# Patient Record
Sex: Female | Born: 1950
Health system: Southern US, Community
[De-identification: ages and names within clinical notes are randomized; demographics above are authoritative.]

## PROBLEM LIST (undated history)

## (undated) DIAGNOSIS — M549 Dorsalgia, unspecified: Secondary | ICD-10-CM

## (undated) DIAGNOSIS — E039 Hypothyroidism, unspecified: Secondary | ICD-10-CM

## (undated) DIAGNOSIS — L309 Dermatitis, unspecified: Secondary | ICD-10-CM

## (undated) DIAGNOSIS — F32A Depression, unspecified: Secondary | ICD-10-CM

## (undated) DIAGNOSIS — T7840XA Allergy, unspecified, initial encounter: Secondary | ICD-10-CM

## (undated) DIAGNOSIS — N3281 Overactive bladder: Secondary | ICD-10-CM

## (undated) DIAGNOSIS — I10 Essential (primary) hypertension: Secondary | ICD-10-CM

## (undated) DIAGNOSIS — J45909 Unspecified asthma, uncomplicated: Secondary | ICD-10-CM

## (undated) DIAGNOSIS — F329 Major depressive disorder, single episode, unspecified: Secondary | ICD-10-CM

## (undated) DIAGNOSIS — N3941 Urge incontinence: Secondary | ICD-10-CM

## (undated) DIAGNOSIS — M1990 Unspecified osteoarthritis, unspecified site: Secondary | ICD-10-CM

## (undated) DIAGNOSIS — R198 Other specified symptoms and signs involving the digestive system and abdomen: Secondary | ICD-10-CM

## (undated) DIAGNOSIS — E785 Hyperlipidemia, unspecified: Secondary | ICD-10-CM

## (undated) DIAGNOSIS — K219 Gastro-esophageal reflux disease without esophagitis: Secondary | ICD-10-CM

## (undated) DIAGNOSIS — R519 Headache, unspecified: Secondary | ICD-10-CM

## (undated) DIAGNOSIS — F419 Anxiety disorder, unspecified: Secondary | ICD-10-CM

## (undated) DIAGNOSIS — R0602 Shortness of breath: Secondary | ICD-10-CM

## (undated) DIAGNOSIS — G8929 Other chronic pain: Secondary | ICD-10-CM

## (undated) DIAGNOSIS — M810 Age-related osteoporosis without current pathological fracture: Secondary | ICD-10-CM

## (undated) HISTORY — DX: Other specified symptoms and signs involving the digestive system and abdomen: R19.8

## (undated) HISTORY — PX: BLADDER SUSPENSION: SHX72

## (undated) HISTORY — DX: Hyperlipidemia, unspecified: E78.5

## (undated) HISTORY — DX: Gastro-esophageal reflux disease without esophagitis: K21.9

## (undated) HISTORY — DX: Unspecified asthma, uncomplicated: J45.909

## (undated) HISTORY — PX: BUNIONECTOMY: SHX129

## (undated) HISTORY — PX: NASAL SINUS SURGERY: SHX719

## (undated) HISTORY — DX: Headache, unspecified: R51.9

## (undated) HISTORY — PX: GYNECOLOGIC CRYOSURGERY: SHX857

## (undated) HISTORY — PX: OTHER SURGICAL HISTORY: SHX169

## (undated) HISTORY — DX: Dorsalgia, unspecified: M54.9

## (undated) HISTORY — DX: Essential (primary) hypertension: I10

## (undated) HISTORY — DX: Shortness of breath: R06.02

## (undated) HISTORY — DX: Unspecified osteoarthritis, unspecified site: M19.90

## (undated) HISTORY — PX: ABDOMINOPLASTY: SUR9

## (undated) HISTORY — DX: Overactive bladder: N32.81

## (undated) HISTORY — DX: Age-related osteoporosis without current pathological fracture: M81.0

## (undated) HISTORY — DX: Hypothyroidism, unspecified: E03.9

## (undated) HISTORY — DX: Allergy, unspecified, initial encounter: T78.40XA

## (undated) HISTORY — DX: Depression, unspecified: F32.A

## (undated) HISTORY — DX: Other chronic pain: G89.29

## (undated) HISTORY — DX: Anxiety disorder, unspecified: F41.9

## (undated) HISTORY — PX: TUBAL LIGATION: SHX77

---
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
## 1985-11-05 HISTORY — PX: RHINOPLASTY: SUR1284

## 1989-07-06 HISTORY — PX: NASAL SINUS SURGERY: SHX719

## 1998-05-13 ENCOUNTER — Emergency Department (HOSPITAL_COMMUNITY): Admission: EM | Admit: 1998-05-13 | Discharge: 1998-05-13 | Payer: Self-pay | Admitting: Internal Medicine

## 2000-05-06 ENCOUNTER — Other Ambulatory Visit: Admission: RE | Admit: 2000-05-06 | Discharge: 2000-05-06 | Payer: Self-pay | Admitting: *Deleted

## 2001-04-23 ENCOUNTER — Other Ambulatory Visit: Admission: RE | Admit: 2001-04-23 | Discharge: 2001-04-23 | Payer: Self-pay | Admitting: *Deleted

## 2002-04-22 ENCOUNTER — Other Ambulatory Visit: Admission: RE | Admit: 2002-04-22 | Discharge: 2002-04-22 | Payer: Self-pay | Admitting: *Deleted

## 2003-07-07 ENCOUNTER — Other Ambulatory Visit: Admission: RE | Admit: 2003-07-07 | Discharge: 2003-07-07 | Payer: Self-pay | Admitting: *Deleted

## 2003-11-18 ENCOUNTER — Emergency Department (HOSPITAL_COMMUNITY): Admission: EM | Admit: 2003-11-18 | Discharge: 2003-11-19 | Payer: Self-pay | Admitting: Emergency Medicine

## 2003-11-19 ENCOUNTER — Ambulatory Visit (HOSPITAL_COMMUNITY): Admission: RE | Admit: 2003-11-19 | Discharge: 2003-11-19 | Payer: Self-pay | Admitting: Family Medicine

## 2004-08-07 ENCOUNTER — Other Ambulatory Visit: Admission: RE | Admit: 2004-08-07 | Discharge: 2004-08-07 | Payer: Self-pay | Admitting: *Deleted

## 2004-09-07 ENCOUNTER — Encounter: Admission: RE | Admit: 2004-09-07 | Discharge: 2004-09-07 | Payer: Self-pay | Admitting: *Deleted

## 2004-11-05 HISTORY — PX: TRANSVAGINAL TAPE (TVT) REMOVAL: SHX6154

## 2005-07-03 ENCOUNTER — Other Ambulatory Visit: Admission: RE | Admit: 2005-07-03 | Discharge: 2005-07-03 | Payer: Self-pay | Admitting: Obstetrics and Gynecology

## 2005-08-03 ENCOUNTER — Ambulatory Visit: Payer: Self-pay | Admitting: Cardiology

## 2005-09-07 ENCOUNTER — Ambulatory Visit: Payer: Self-pay | Admitting: Cardiology

## 2005-09-11 ENCOUNTER — Ambulatory Visit: Payer: Self-pay

## 2005-09-17 ENCOUNTER — Ambulatory Visit: Payer: Self-pay | Admitting: Cardiology

## 2005-10-17 ENCOUNTER — Ambulatory Visit: Payer: Self-pay | Admitting: Cardiology

## 2005-10-24 ENCOUNTER — Ambulatory Visit: Payer: Self-pay | Admitting: Cardiology

## 2005-11-16 ENCOUNTER — Ambulatory Visit: Payer: Self-pay | Admitting: Internal Medicine

## 2005-11-30 ENCOUNTER — Ambulatory Visit: Payer: Self-pay | Admitting: Internal Medicine

## 2007-12-23 HISTORY — PX: CARDIOVASCULAR STRESS TEST: SHX262

## 2008-06-21 ENCOUNTER — Other Ambulatory Visit: Admission: RE | Admit: 2008-06-21 | Discharge: 2008-06-21 | Payer: Self-pay | Admitting: Obstetrics & Gynecology

## 2010-07-24 ENCOUNTER — Ambulatory Visit: Payer: Self-pay | Admitting: Family Medicine

## 2010-08-29 ENCOUNTER — Ambulatory Visit: Payer: Self-pay | Admitting: Family Medicine

## 2010-11-05 HISTORY — PX: NASAL SEPTUM SURGERY: SHX37

## 2011-03-19 ENCOUNTER — Other Ambulatory Visit: Payer: Self-pay | Admitting: Family Medicine

## 2011-10-11 ENCOUNTER — Encounter (INDEPENDENT_AMBULATORY_CARE_PROVIDER_SITE_OTHER): Payer: Self-pay | Admitting: General Surgery

## 2011-10-12 ENCOUNTER — Encounter (INDEPENDENT_AMBULATORY_CARE_PROVIDER_SITE_OTHER): Payer: Self-pay | Admitting: General Surgery

## 2011-10-12 ENCOUNTER — Ambulatory Visit (INDEPENDENT_AMBULATORY_CARE_PROVIDER_SITE_OTHER): Payer: 59 | Admitting: General Surgery

## 2011-10-12 VITALS — BP 130/78 | HR 76 | Temp 97.6°F | Resp 14 | Ht 61.0 in | Wt 163.8 lb

## 2011-10-12 DIAGNOSIS — D171 Benign lipomatous neoplasm of skin and subcutaneous tissue of trunk: Secondary | ICD-10-CM

## 2011-10-12 DIAGNOSIS — D1779 Benign lipomatous neoplasm of other sites: Secondary | ICD-10-CM

## 2011-10-12 NOTE — Progress Notes (Signed)
Patient ID: Maureen Love, female   DOB: 08-27-1951, 60 y.o.   MRN: [id number]  Chief Complaint  Patient presents with  . Mass    on back and leg    HPI Maureen Love is a 60 y.o. female.   HPI This patient is referred by Dr. Hyacinth Meeker for evaluation of enlarging right shoulder and back lipoma. She states that she has had this for a while and has also noticed a smaller mass on her right thigh. She states that the mass on her right thigh has not enlarged but has become more tender recently and the right shoulder mass has actually been increasing in size. She saw her gynecologist and her gynecologist also noticed another mass on her left upper back as well. This was not tender. She has no other masses that she knows of and denies any other symptoms. She has no family history of malignancy. Past Medical History  Diagnosis Date  . Arthritis   . Hyperlipidemia     Past Surgical History  Procedure Date  . Nasal sinus surgery   . Mesh for urination     History reviewed. No pertinent family history.  Social History History  Substance Use Topics  . Smoking status: Never Smoker   . Smokeless tobacco: Not on file  . Alcohol Use: Yes    Allergies  Allergen Reactions  . Penicillins     No current outpatient prescriptions on file.    Review of Systems Review of Systems All other review of systems negative or noncontributory except as stated in the HPI  Blood pressure 130/78, pulse 76, temperature 97.6 F (36.4 C), temperature source Temporal, resp. rate 14, height 5\' 1"  (1.549 m), weight 163 lb 12.8 oz (74.299 kg).  Physical Exam Physical Exam  Vitals reviewed. Constitutional: She is oriented to person, place, and time. She appears well-developed and well-nourished. No distress.  HENT:  Head: Normocephalic and atraumatic.  Eyes: Conjunctivae are normal. Pupils are equal, round, and reactive to light. Right eye exhibits no discharge. Left eye exhibits no discharge. No scleral  icterus.  Neck: Normal range of motion. No tracheal deviation present.  Cardiovascular: Normal rate, regular rhythm and normal heart sounds.   Pulmonary/Chest: Effort normal and breath sounds normal. No stridor. No respiratory distress. She has no wheezes.  Abdominal: Soft. Bowel sounds are normal. She exhibits no distension. There is no tenderness.  Musculoskeletal: Normal range of motion. She exhibits no edema.  Neurological: She is alert and oriented to person, place, and time.  Skin: Skin is warm and dry. No rash noted. She is not diaphoretic. No erythema. No pallor.       On her right side she has 2 soft tissue masses on the anterior lateral thigh which are in the 1-2 cm range. There is no evidence of infection and there is no significant tenderness on exam today.  On her right posterior shoulder superficial to her scapula she has a 7 centimeter by 7 cm well-defined soft tissue mass which is most likely consistent with a lipoma. There is no evidence of erythema or infection.  On her left upper medial back she has a 6 cm well-defined lipomatous appearing mass which is nontender.  Psychiatric: She has a normal mood and affect. Her behavior is normal. Judgment and thought content normal.    Data Reviewed   Assessment    Soft tissue masses of her left upper back, right posterior shoulder, and right anterior thigh which are most likely lipomas.  Since some of these are increasing in size and causing symptoms she has decided that she wants them removed for treatment as well as for definitive diagnosis. I explained that these are most likely lipomas and there is a small chance of malignancy although this would be uncommon. I discussed with her the options for continued observation versus surgical excision and she would like to have them removed. We discussed the risks of surgery including infection, bleeding, pain, scarring, persistent symptoms, recurrence, nerve injury and she expressed  understanding and desires to proceed.    Plan    We will plan to proceed with excision of her 2 back masses and her right thigh lesions when convenient for her.       Lodema Pilot DAVID 10/12/2011, 12:40 PM

## 2011-11-23 ENCOUNTER — Encounter (INDEPENDENT_AMBULATORY_CARE_PROVIDER_SITE_OTHER): Payer: Self-pay | Admitting: General Surgery

## 2011-11-23 ENCOUNTER — Ambulatory Visit (INDEPENDENT_AMBULATORY_CARE_PROVIDER_SITE_OTHER): Payer: 59 | Admitting: General Surgery

## 2011-11-23 VITALS — BP 122/78 | HR 72 | Temp 97.4°F | Resp 18 | Ht 61.0 in | Wt 167.0 lb

## 2011-11-23 DIAGNOSIS — D1779 Benign lipomatous neoplasm of other sites: Secondary | ICD-10-CM

## 2011-11-23 DIAGNOSIS — D171 Benign lipomatous neoplasm of skin and subcutaneous tissue of trunk: Secondary | ICD-10-CM

## 2011-11-23 NOTE — Progress Notes (Signed)
Patient ID: Maureen Love, female   DOB: [birth year], 61 y.o.   MRN: [id number]  Chief Complaint  Patient presents with  . Pre-op Exam    Lipoma excision    HPI Maureen Love is a 61 y.o. female.She returns today for a preoperative appointment. She states that she's had no changes since her last visit a month ago. She has an enlarging lipoma of her right upper back and a few lipomas in her right anterior thigh which are symptomatic. She denies any changes in the lesions since her last visit. HPI  Past Medical History  Diagnosis Date  . Arthritis   . Hyperlipidemia   . Lipoma     Past Surgical History  Procedure Date  . Nasal sinus surgery   . Mesh for urination     No family history on file.  Social History History  Substance Use Topics  . Smoking status: Never Smoker   . Smokeless tobacco: Not on file  . Alcohol Use: Yes    Allergies  Allergen Reactions  . Penicillins Swelling    No current outpatient prescriptions on file.    Review of Systems Review of Systems All other review of systems negative or noncontributory except as stated in the HPI  Blood pressure 122/78, pulse 72, temperature 97.4 F (36.3 C), temperature source Temporal, resp. rate 18, height 5\' 1"  (1.549 m), weight 167 lb (75.751 kg).  Physical Exam Physical Exam Vitals reviewed.  Constitutional: She is oriented to person, place, and time. She appears well-developed and well-nourished. No distress.  HENT:  Head: Normocephalic and atraumatic.  Eyes: Conjunctivae are normal. Pupils are equal, round, and reactive to light. Right eye exhibits no discharge. Left eye exhibits no discharge. No scleral icterus.  Neck: Normal range of motion. No tracheal deviation present.  Cardiovascular: Normal rate, regular rhythm and normal heart sounds.  Pulmonary/Chest: Effort normal and breath sounds normal. No stridor. No respiratory distress. She has no wheezes.  Abdominal: Soft. Bowel sounds are normal. She  exhibits no distension. There is no tenderness.  Musculoskeletal: Normal range of motion. She exhibits no edema.  Neurological: She is alert and oriented to person, place, and time.  Skin: Skin is warm and dry. No rash noted. She is not diaphoretic. No erythema. No pallor.  On her right side she has 2 soft tissue masses on the anterior lateral thigh which are in the 1-2 cm range. There is no evidence of infection and there is no significant tenderness on exam today.  On her right posterior shoulder superficial to her scapula she has a 7 centimeter by 7 cm well-defined soft tissue mass which is most likely consistent with a lipoma. There is no evidence of erythema or infection.  On her left upper medial back she has a 6 cm well-defined lipomatous appearing mass which is nontender.  Psychiatric: She has a normal mood and affect. Her behavior is normal. Judgment and thought content normal.   Data Reviewed   Assessment    Right upper back and shoulder lipoma right anterior thigh lipomas These masses are enlarging and symptomatic for her and so she would is concern for possible malignancy as well as she is concerned for discomfort. She would like to have this removed and we will plan for excision in convenient. We discussed the risks of the procedure including infection, bleeding, pain, scarring, nerve injury, recurrence, poor cosmesis and she expressed understanding and desires to proceed.    Plan    We will  plan for excision later this week.       Lodema Pilot DAVID 11/23/2011, 10:26 AM

## 2011-11-28 ENCOUNTER — Other Ambulatory Visit: Payer: Self-pay

## 2011-11-28 DIAGNOSIS — D1739 Benign lipomatous neoplasm of skin and subcutaneous tissue of other sites: Secondary | ICD-10-CM

## 2011-11-28 HISTORY — PX: OTHER SURGICAL HISTORY: SHX169

## 2011-12-14 ENCOUNTER — Other Ambulatory Visit (INDEPENDENT_AMBULATORY_CARE_PROVIDER_SITE_OTHER): Payer: Self-pay

## 2011-12-14 ENCOUNTER — Ambulatory Visit (INDEPENDENT_AMBULATORY_CARE_PROVIDER_SITE_OTHER): Payer: 59 | Admitting: General Surgery

## 2011-12-14 DIAGNOSIS — Z5189 Encounter for other specified aftercare: Secondary | ICD-10-CM

## 2011-12-14 DIAGNOSIS — Z4889 Encounter for other specified surgical aftercare: Secondary | ICD-10-CM

## 2011-12-14 NOTE — Progress Notes (Signed)
Subjective:     Patient ID: Maureen Love, female   DOB: 1951/10/19, 61 y.o.   MRN: [id number]  HPI This patient follows up status post excision of lipomas from her back and her right thigh. She has no complaints and is pain resolved. His pathology was benign. She was putting some anti-itch cream in her thigh which cause some redness around the wound but she has no fevers, and drainage, or tenderness. She has no redness around the wound on her back and there was no redness in this area.  Review of Systems     Objective:   Physical Exam Her incisions are healing well without sign of infection. She does have some well demarcated perfect circles of redness around each of her thigh wounds which she says is the exact area where she was applying the anti itch cream. This was nonblanching and does not appear to be any infection.    Assessment:     Status post excision of back lipoma and right thigh lipomas She seems to doing well and has no complaints. There is no evidence of infection. The redness around her thigh wounds is consistent with the area which was applying cream as she is no longer applying this anti-itch cream. Pathology was benign.    Plan:     She can followup p.r.n. basis.

## 2012-01-08 ENCOUNTER — Encounter (INDEPENDENT_AMBULATORY_CARE_PROVIDER_SITE_OTHER): Payer: Self-pay | Admitting: General Surgery

## 2013-05-18 ENCOUNTER — Other Ambulatory Visit: Payer: Self-pay | Admitting: Urology

## 2013-06-17 ENCOUNTER — Encounter (HOSPITAL_BASED_OUTPATIENT_CLINIC_OR_DEPARTMENT_OTHER): Payer: Self-pay | Admitting: *Deleted

## 2013-06-17 NOTE — Progress Notes (Signed)
NPO AFTER MN. ARRIVES AT 0600. NEEDS ISTAT AND EKG. 

## 2013-06-24 NOTE — H&P (Signed)
History of Present Illness   Ms. Leider for over 6 months had almost complete resolution of her urinary frequency and urge incontinence. In the last 3 weeks or so, without any symptoms of burning or cloudy or foul-smelling urine, she has had acute recurrence of her urge incontinence and frequency.   There is no other modifying factors or associated signs or symptoms. There is no other aggravating or relieving factors. The symptoms are moderate in severity and persisting. She could not leave a urine sample today but clinically was not infected. I thought next time it would be nice to perform it under IV sedation since it was quite uncomfortable.   Review of Systems: No change in bowel or neurologic systems. She could feel the needle but not the injection itself.    Past Medical History Problems  1. History of  Anxiety (Symptom) 300.00 2. History of  Arthritis V13.4 3. History of  Asthma 493.90 4. History of  Depression 311 5. History of  Esophageal Reflux 530.81 6. History of  Hypercholesterolemia 272.0 7. History of  Hypertension 401.9  Surgical History Problems  1. History of  Bladder Surgery 2. History of  Foot Surgery 3. History of  Rhinoplasty  Current Meds 1. Azelastine HCl 137 MCG/SPRAY Nasal Solution; Therapy: 01Mar2013 to 2. EpiPen 2-Pak 0.3 MG/0.3ML DEVI; Therapy: 18Mar2013 to 3. Fish Oil CAPS; Therapy: (Recorded:15Mar2013) to 4. Hydrocodone-Homatropine 5-1.5 MG/5ML Oral Syrup; Therapy: 08Mar2013 to 5. Juice Plus Fibre LIQD; Therapy: (Recorded:15Mar2013) to 6. Lisinopril 10 MG Oral Tablet; Therapy: 05Mar2013 to 7. Myrbetriq 50 MG Oral Tablet Extended Release 24 Hour; Take 1 tablet daily; Therapy: 09May2013  to (Evaluate:04May2014); Last Rx:09May2013 8. Nasonex SUSP; Therapy: (Recorded:15Mar2013) to 9. Pataday 0.2 % Ophthalmic Solution; Therapy: 19Jun2013 to 10. Qvar 80 MCG/ACT Inhalation Aerosol Solution; Therapy: 18Mar2013 to 11. Sulfamethoxazole-TMP DS 800-160 MG Oral  Tablet; one tablet bid for 3 days; Therapy:   19Aug2013 to (Last Rx:19Aug2013)  Requested for: 19Aug2013 12. Vitamin D3 CAPS; Therapy: (Recorded:15Mar2013) to  Allergies Medication  1. Penicillins  Family History Problems  1. Paternal aunt's history of  Cardiac Arrest 2. Paternal uncle's history of  Cardiac Arrest 3. Maternal aunt's history of  Cardiac Arrest 4. Paternal history of  Death In The Family Father father passed @ age 72stroke 5. Family history of  Family Health Status Number Of Children 1 deceased son @ age 32heart attack 53. Maternal history of  Migraine Headache 7. Fraternal history of  Migraine Headache 8. Sororal history of  Migraine Headache 9. Paternal history of  Stroke Syndrome V17.1  Social History Problems  1. Alcohol Use one drink weekly 2. Caffeine Use 3 drinks a week 3. Marital History - Currently Married 4. Never A Smoker 5. Occupation: Retired Denied  6. History of  Tobacco Use  Vitals Vital Signs [Data Includes: Last 1 Day]  20May2014 03:26PM  BMI Calculated: 31.53 BSA Calculated: 1.75 Height: 5 ft 1 in Weight: 167 lb  Blood Pressure: 150 / 103, Sitting Temperature: 98.6 F Heart Rate: 99 Respiration: 20  Plan   Discussion/Summary   Ms. Reddoch would like to have a repeat Botox. I am going to ask Dawn to call her about a Botox in the office versus under IV sedation. There is no question it was uncomfortable before but the ___ now is different and I would give her ___ Vicodin prior. This was discussed. She wants Dawn to call her. And so 100 units will be utilized. Failure rates with repeat injections was also discussed.  After a thorough review of the management options for the patient's condition the patient  elected to proceed with surgical therapy as noted above. We have discussed the potential benefits and risks of the procedure, side effects of the proposed treatment, the likelihood of the patient achieving the goals of the procedure,  and any potential problems that might occur during the procedure or recuperation. Informed consent has been obtained.

## 2013-06-25 ENCOUNTER — Encounter (HOSPITAL_BASED_OUTPATIENT_CLINIC_OR_DEPARTMENT_OTHER): Payer: Self-pay | Admitting: *Deleted

## 2013-06-25 ENCOUNTER — Other Ambulatory Visit: Payer: Self-pay

## 2013-06-25 ENCOUNTER — Encounter (HOSPITAL_BASED_OUTPATIENT_CLINIC_OR_DEPARTMENT_OTHER)
Admission: RE | Disposition: A | Discharge status: Home / Self Care | Payer: Self-pay | Source: Ambulatory Visit | Attending: Urology

## 2013-06-25 ENCOUNTER — Ambulatory Visit (HOSPITAL_BASED_OUTPATIENT_CLINIC_OR_DEPARTMENT_OTHER): Payer: 59 | Admitting: Anesthesiology

## 2013-06-25 ENCOUNTER — Ambulatory Visit (HOSPITAL_BASED_OUTPATIENT_CLINIC_OR_DEPARTMENT_OTHER)
Admission: RE | Admit: 2013-06-25 | Discharge: 2013-06-25 | Disposition: A | Discharge status: Home / Self Care | Payer: 59 | Source: Ambulatory Visit | Attending: Urology | Admitting: Urology

## 2013-06-25 ENCOUNTER — Encounter (HOSPITAL_BASED_OUTPATIENT_CLINIC_OR_DEPARTMENT_OTHER): Payer: Self-pay | Admitting: Anesthesiology

## 2013-06-25 DIAGNOSIS — J45909 Unspecified asthma, uncomplicated: Secondary | ICD-10-CM | POA: Insufficient documentation

## 2013-06-25 DIAGNOSIS — K219 Gastro-esophageal reflux disease without esophagitis: Secondary | ICD-10-CM | POA: Insufficient documentation

## 2013-06-25 DIAGNOSIS — Z79899 Other long term (current) drug therapy: Secondary | ICD-10-CM | POA: Insufficient documentation

## 2013-06-25 DIAGNOSIS — Z880 Allergy status to penicillin: Secondary | ICD-10-CM | POA: Insufficient documentation

## 2013-06-25 DIAGNOSIS — Z8249 Family history of ischemic heart disease and other diseases of the circulatory system: Secondary | ICD-10-CM | POA: Insufficient documentation

## 2013-06-25 DIAGNOSIS — R350 Frequency of micturition: Secondary | ICD-10-CM | POA: Insufficient documentation

## 2013-06-25 DIAGNOSIS — F411 Generalized anxiety disorder: Secondary | ICD-10-CM | POA: Insufficient documentation

## 2013-06-25 DIAGNOSIS — Z823 Family history of stroke: Secondary | ICD-10-CM | POA: Insufficient documentation

## 2013-06-25 DIAGNOSIS — IMO0002 Reserved for concepts with insufficient information to code with codable children: Secondary | ICD-10-CM | POA: Insufficient documentation

## 2013-06-25 DIAGNOSIS — E7800 Pure hypercholesterolemia, unspecified: Secondary | ICD-10-CM | POA: Insufficient documentation

## 2013-06-25 DIAGNOSIS — F3289 Other specified depressive episodes: Secondary | ICD-10-CM | POA: Insufficient documentation

## 2013-06-25 DIAGNOSIS — N35919 Unspecified urethral stricture, male, unspecified site: Secondary | ICD-10-CM | POA: Insufficient documentation

## 2013-06-25 DIAGNOSIS — N3941 Urge incontinence: Secondary | ICD-10-CM | POA: Insufficient documentation

## 2013-06-25 DIAGNOSIS — F329 Major depressive disorder, single episode, unspecified: Secondary | ICD-10-CM | POA: Insufficient documentation

## 2013-06-25 DIAGNOSIS — I10 Essential (primary) hypertension: Secondary | ICD-10-CM | POA: Insufficient documentation

## 2013-06-25 DIAGNOSIS — M129 Arthropathy, unspecified: Secondary | ICD-10-CM | POA: Insufficient documentation

## 2013-06-25 HISTORY — DX: Urge incontinence: N39.41

## 2013-06-25 HISTORY — DX: Unspecified osteoarthritis, unspecified site: M19.90

## 2013-06-25 HISTORY — PX: CYSTOSCOPY WITH INJECTION: SHX1424

## 2013-06-25 HISTORY — DX: Unspecified asthma, uncomplicated: J45.909

## 2013-06-25 HISTORY — DX: Essential (primary) hypertension: I10

## 2013-06-25 LAB — POCT I-STAT 4, (NA,K, GLUC, HGB,HCT)
Glucose, Bld: 96 mg/dL (ref 70–99)
HCT: 34 % — ABNORMAL LOW (ref 36.0–46.0)
Hemoglobin: 11.6 g/dL — ABNORMAL LOW (ref 12.0–15.0)
Potassium: 3.9 meq/L (ref 3.5–5.1)
Sodium: 142 meq/L (ref 135–145)

## 2013-06-25 SURGERY — CYSTOSCOPY, WITH INJECTION OF BLADDER NECK OR BLADDER WALL
Anesthesia: General | Site: Bladder | Wound class: Clean Contaminated

## 2013-06-25 MED ORDER — PROMETHAZINE HCL 25 MG/ML IJ SOLN
6.2500 mg | INTRAMUSCULAR | Status: DC | PRN
  Filled 2013-06-25: qty 1

## 2013-06-25 MED ORDER — CIPROFLOXACIN IN D5W 400 MG/200ML IV SOLN
400.0000 mg | INTRAVENOUS | Status: AC
  Administered 2013-06-25: 400 mg via INTRAVENOUS
  Filled 2013-06-25: qty 200

## 2013-06-25 MED ORDER — LIDOCAINE HCL (CARDIAC) 20 MG/ML IV SOLN
INTRAVENOUS | Status: DC | PRN
  Administered 2013-06-25: 100 mg via INTRAVENOUS

## 2013-06-25 MED ORDER — SODIUM CHLORIDE 0.9 % IJ SOLN
INTRAMUSCULAR | Status: DC | PRN
  Administered 2013-06-25: 10 mL

## 2013-06-25 MED ORDER — MEPERIDINE HCL 25 MG/ML IJ SOLN
6.2500 mg | INTRAMUSCULAR | Status: DC | PRN
  Filled 2013-06-25: qty 1

## 2013-06-25 MED ORDER — FENTANYL CITRATE 0.05 MG/ML IJ SOLN
25.0000 ug | INTRAMUSCULAR | Status: DC | PRN
  Filled 2013-06-25: qty 1

## 2013-06-25 MED ORDER — LACTATED RINGERS IV SOLN
INTRAVENOUS | Status: DC
  Administered 2013-06-25: 07:00:00 via INTRAVENOUS
  Filled 2013-06-25: qty 1000

## 2013-06-25 MED ORDER — ONDANSETRON HCL 4 MG/2ML IJ SOLN
INTRAMUSCULAR | Status: DC | PRN
  Administered 2013-06-25: 4 mg via INTRAVENOUS

## 2013-06-25 MED ORDER — LACTATED RINGERS IV SOLN
INTRAVENOUS | Status: DC | PRN
  Administered 2013-06-25: 07:00:00 via INTRAVENOUS

## 2013-06-25 MED ORDER — MIDAZOLAM HCL 5 MG/5ML IJ SOLN
INTRAMUSCULAR | Status: DC | PRN
  Administered 2013-06-25 (×2): 1 mg via INTRAVENOUS

## 2013-06-25 MED ORDER — HYDROCODONE-ACETAMINOPHEN 5-325 MG PO TABS: 1.0000 | ORAL_TABLET | Freq: Four times a day (QID) | ORAL | Status: DC | PRN

## 2013-06-25 MED ORDER — STERILE WATER FOR IRRIGATION IR SOLN
Status: DC | PRN
  Administered 2013-06-25: 3000 mL via INTRAVESICAL

## 2013-06-25 MED ORDER — KETOROLAC TROMETHAMINE 30 MG/ML IJ SOLN
INTRAMUSCULAR | Status: DC | PRN
  Administered 2013-06-25: 30 mg via INTRAVENOUS

## 2013-06-25 MED ORDER — ONABOTULINUMTOXINA 100 UNITS IJ SOLR
INTRAMUSCULAR | Status: DC | PRN
  Administered 2013-06-25: 100 [IU] via INTRAMUSCULAR

## 2013-06-25 MED ORDER — DEXAMETHASONE SODIUM PHOSPHATE 4 MG/ML IJ SOLN
INTRAMUSCULAR | Status: DC | PRN
  Administered 2013-06-25: 8 mg via INTRAVENOUS

## 2013-06-25 MED ORDER — CIPROFLOXACIN HCL 250 MG PO TABS: 250.0000 mg | ORAL_TABLET | Freq: Two times a day (BID) | ORAL | Status: DC

## 2013-06-25 MED ORDER — PROPOFOL 10 MG/ML IV BOLUS
INTRAVENOUS | Status: DC | PRN
  Administered 2013-06-25: 200 mg via INTRAVENOUS

## 2013-06-25 MED ORDER — FENTANYL CITRATE 0.05 MG/ML IJ SOLN
INTRAMUSCULAR | Status: DC | PRN
  Administered 2013-06-25 (×2): 50 ug via INTRAVENOUS

## 2013-06-25 MED ORDER — LACTATED RINGERS IV SOLN
INTRAVENOUS | Status: DC
  Filled 2013-06-25: qty 1000

## 2013-06-25 SURGICAL SUPPLY — 23 items
BAG DRAIN URO-CYSTO SKYTR STRL (DRAIN) ×2 IMPLANT
BAG DRN UROCATH (DRAIN) ×1
CANISTER SUCT LVC 12 LTR MEDI- (MISCELLANEOUS) ×1 IMPLANT
CATH ROBINSON RED A/P 12FR (CATHETERS) IMPLANT
CLOTH BEACON ORANGE TIMEOUT ST (SAFETY) ×2 IMPLANT
DRAPE CAMERA CLOSED 9X96 (DRAPES) ×2 IMPLANT
DRSG PAD ABDOMINAL 8X10 ST (GAUZE/BANDAGES/DRESSINGS) ×1 IMPLANT
ELECT REM PT RETURN 9FT ADLT (ELECTROSURGICAL)
ELECTRODE REM PT RTRN 9FT ADLT (ELECTROSURGICAL) IMPLANT
GLOVE BIO SURGEON STRL SZ7.5 (GLOVE) ×2 IMPLANT
GLOVE BIOGEL PI IND STRL 6.5 (GLOVE) IMPLANT
GLOVE BIOGEL PI INDICATOR 6.5 (GLOVE) ×2
GLOVE ECLIPSE 6.5 STRL STRAW (GLOVE) ×1 IMPLANT
GOWN PREVENTION PLUS LG XLONG (DISPOSABLE) ×2 IMPLANT
GOWN STRL NON-REIN LRG LVL3 (GOWN DISPOSABLE) ×1 IMPLANT
GOWN STRL REIN XL XLG (GOWN DISPOSABLE) ×2 IMPLANT
NDL SAFETY ECLIPSE 18X1.5 (NEEDLE) ×1 IMPLANT
NEEDLE HYPO 18GX1.5 SHARP (NEEDLE) ×2
PACK CYSTOSCOPY (CUSTOM PROCEDURE TRAY) ×2 IMPLANT
SYR 20CC LL (SYRINGE) IMPLANT
SYR 30ML LL (SYRINGE) ×3 IMPLANT
SYR BULB IRRIGATION 50ML (SYRINGE) IMPLANT
WATER STERILE IRR 3000ML UROMA (IV SOLUTION) ×2 IMPLANT

## 2013-06-25 NOTE — Anesthesia Postprocedure Evaluation (Signed)
  Anesthesia Post-op Note  Patient: Maureen Love [facility]  Procedure(s) Performed: Procedure(s) (LRB): CYSTOSCOPY WITH BOTOX INJECTION (N/A)  Patient Location: PACU  Anesthesia Type: General  Level of Consciousness: awake and alert   Airway and Oxygen Therapy: Patient Spontanous Breathing  Post-op Pain: mild  Post-op Assessment: Post-op Vital signs reviewed, Patient's Cardiovascular Status Stable, Respiratory Function Stable, Patent Airway and No signs of Nausea or vomiting  Last Vitals:  Filed Vitals:   06/25/13 0815  BP: 129/70  Pulse: 78  Temp:   Resp: 16    Post-op Vital Signs: stable   Complications: No apparent anesthesia complications

## 2013-06-25 NOTE — Anesthesia Preprocedure Evaluation (Addendum)
Anesthesia Evaluation  Patient identified by MRN, date of birth, ID band Patient awake    Reviewed: Allergy & Precautions, H&P , NPO status , Patient's Chart, lab work & pertinent test results  History of Anesthesia Complications (+) PONV  Airway Mallampati: II TM Distance: >3 FB Neck ROM: Full    Dental no notable dental hx.    Pulmonary asthma ,  breath sounds clear to auscultation  Pulmonary exam normal - wheezing      Cardiovascular hypertension, Pt. on medications Rhythm:Regular Rate:Normal     Neuro/Psych negative neurological ROS  negative psych ROS   GI/Hepatic negative GI ROS, Neg liver ROS,   Endo/Other  negative endocrine ROS  Renal/GU negative Renal ROS  negative genitourinary   Musculoskeletal negative musculoskeletal ROS (+)   Abdominal   Peds negative pediatric ROS (+)  Hematology negative hematology ROS (+)   Anesthesia Other Findings Upper front crowns  Reproductive/Obstetrics negative OB ROS                          Anesthesia Physical Anesthesia Plan  ASA: II  Anesthesia Plan: General   Post-op Pain Management:    Induction: Intravenous  Airway Management Planned: LMA  Additional Equipment:   Intra-op Plan:   Post-operative Plan:   Informed Consent: I have reviewed the patients History and Physical, chart, labs and discussed the procedure including the risks, benefits and alternatives for the proposed anesthesia with the patient or authorized representative who has indicated his/her understanding and acceptance.   Dental advisory given  Plan Discussed with: CRNA  Anesthesia Plan Comments:         Anesthesia Quick Evaluation

## 2013-06-25 NOTE — Transfer of Care (Signed)
Immediate Anesthesia Transfer of Care Note  Patient: Maureen Love [facility]  Procedure(s) Performed: Procedure(s) (LRB): CYSTOSCOPY WITH BOTOX INJECTION (N/A)  Patient Location: PACU  Anesthesia Type: General  Level of Consciousness: awake, sedated, patient cooperative and responds to stimulation  Airway & Oxygen Therapy: Patient Spontanous Breathing and Patient connected to face mask oxygen  Post-op Assessment: Report given to PACU RN, Post -op Vital signs reviewed and stable and Patient moving all extremities  Post vital signs: Reviewed and stable  Complications: No apparent anesthesia complications

## 2013-06-25 NOTE — Transfer of Care (Signed)
  Immediate Anesthesia Transfer of Care Note  Patient: Maureen Love [facility]  Procedure(s) Performed: Procedure(s) (LRB): CYSTOSCOPY WITH BOTOX INJECTION (N/A)  Patient Location: PACU  Anesthesia Type: General  Level of Consciousness: awake, sedated, patient cooperative and responds to stimulation  Airway & Oxygen Therapy: Patient Spontanous Breathing and Patient connected to face mask oxygen  Post-op Assessment: Report given to PACU RN, Post -op Vital signs reviewed and stable and Patient moving all extremities  Post vital signs: Reviewed and stable  Complications: No apparent anesthesia complications

## 2013-06-25 NOTE — Op Note (Signed)
Preoperative diagnosis: Refractory urgency incontinence and frequency Postoperative diagnosis: Refractory urgency incontinence and frequency and meatal stenosis Surgery: Urethral dilation and cystoscopy and injection of botulinum toxin Surgeon: Dr. Lorin Picket MacDiarmid  The patient has the above diagnoses and consented above procedure. Preoperative antibiotics were given. As before the patient had mild meatal stenosis not allowing insertion of the ACMI injection scope. Utilizing well-lubricated sounds I dilated from 14-24 [place]. There was a little bit of bleeding at 6:00 the meatus. An ABD. pad was used at the end of the case for this.  I then cystoscoped the patient. Trigone was normal. There is no stitch or foreign body or carcinoma. Is no cystitis. She was filled to approximately 200 mL. I injected 100 units of Botox in 10 cc and normal saline using my template at 5 and 1:61 and cephalad to the interureteric ridge in the lower half of the bladder. The injections went very well. Is no bleeding. The bladder was emptied. Patient taken to recovery

## 2013-06-25 NOTE — Interval H&P Note (Signed)
History and Physical Interval Note:  06/25/2013 7:19 AM  Maureen Love  has presented today for surgery, with the diagnosis of URGE INCONTINENCE  The various methods of treatment have been discussed with the patient and family. After consideration of risks, benefits and other options for treatment, the patient has consented to  Procedure(s): CYSTOSCOPY WITH BOTOX INJECTION (N/A) as a surgical intervention .  The patient's history has been reviewed, patient examined, no change in status, stable for surgery.  I have reviewed the patient's chart and labs.  Questions were answered to the patient's satisfaction.     MACDIARMID,SCOTT A

## 2013-06-26 ENCOUNTER — Encounter (HOSPITAL_BASED_OUTPATIENT_CLINIC_OR_DEPARTMENT_OTHER): Payer: Self-pay | Admitting: Urology

## 2013-11-01 ENCOUNTER — Emergency Department (HOSPITAL_COMMUNITY)
Admission: EM | Admit: 2013-11-01 | Discharge: 2013-11-01 | Disposition: A | Discharge status: Home / Self Care | Payer: 59 | Attending: Emergency Medicine | Admitting: Emergency Medicine

## 2013-11-01 ENCOUNTER — Encounter (HOSPITAL_COMMUNITY): Payer: Self-pay | Admitting: Emergency Medicine

## 2013-11-01 DIAGNOSIS — Z880 Allergy status to penicillin: Secondary | ICD-10-CM | POA: Insufficient documentation

## 2013-11-01 DIAGNOSIS — IMO0002 Reserved for concepts with insufficient information to code with codable children: Secondary | ICD-10-CM | POA: Insufficient documentation

## 2013-11-01 DIAGNOSIS — J45901 Unspecified asthma with (acute) exacerbation: Secondary | ICD-10-CM | POA: Insufficient documentation

## 2013-11-01 DIAGNOSIS — E785 Hyperlipidemia, unspecified: Secondary | ICD-10-CM | POA: Insufficient documentation

## 2013-11-01 DIAGNOSIS — Z9889 Other specified postprocedural states: Secondary | ICD-10-CM | POA: Insufficient documentation

## 2013-11-01 DIAGNOSIS — Z87448 Personal history of other diseases of urinary system: Secondary | ICD-10-CM | POA: Insufficient documentation

## 2013-11-01 DIAGNOSIS — R059 Cough, unspecified: Secondary | ICD-10-CM

## 2013-11-01 DIAGNOSIS — M1990 Unspecified osteoarthritis, unspecified site: Secondary | ICD-10-CM | POA: Insufficient documentation

## 2013-11-01 DIAGNOSIS — Z79899 Other long term (current) drug therapy: Secondary | ICD-10-CM | POA: Insufficient documentation

## 2013-11-01 DIAGNOSIS — R05 Cough: Secondary | ICD-10-CM

## 2013-11-01 DIAGNOSIS — I10 Essential (primary) hypertension: Secondary | ICD-10-CM | POA: Insufficient documentation

## 2013-11-01 DIAGNOSIS — R079 Chest pain, unspecified: Secondary | ICD-10-CM | POA: Insufficient documentation

## 2013-11-01 MED ORDER — IPRATROPIUM BROMIDE 0.02 % IN SOLN
0.5000 mg | Freq: Once | RESPIRATORY_TRACT | Status: AC
  Administered 2013-11-01: 0.5 mg via RESPIRATORY_TRACT
  Filled 2013-11-01: qty 2.5

## 2013-11-01 MED ORDER — ALBUTEROL SULFATE HFA 108 (90 BASE) MCG/ACT IN AERS
2.0000 | INHALATION_SPRAY | RESPIRATORY_TRACT | Status: DC | PRN
  Administered 2013-11-01: 2 via RESPIRATORY_TRACT
  Filled 2013-11-01: qty 6.7

## 2013-11-01 MED ORDER — ALBUTEROL SULFATE (5 MG/ML) 0.5% IN NEBU
5.0000 mg | INHALATION_SOLUTION | Freq: Once | RESPIRATORY_TRACT | Status: AC
  Administered 2013-11-01: 5 mg via RESPIRATORY_TRACT
  Filled 2013-11-01: qty 1

## 2013-11-01 MED ORDER — BENZONATATE 100 MG PO CAPS: 100.0000 mg | ORAL_CAPSULE | Freq: Three times a day (TID) | ORAL | Status: DC

## 2013-11-01 MED ORDER — PREDNISONE 20 MG PO TABS: 40.0000 mg | ORAL_TABLET | Freq: Every day | ORAL | Status: DC

## 2013-11-01 MED ORDER — PREDNISONE 20 MG PO TABS
40.0000 mg | ORAL_TABLET | Freq: Once | ORAL | Status: AC
  Administered 2013-11-01: 40 mg via ORAL
  Filled 2013-11-01: qty 2

## 2013-11-01 NOTE — ED Notes (Signed)
Per pt, cold symptoms since the 25 ht-increase cough, chest congestion-no relief with OTC meds-unable to sleep-effecting asthma

## 2013-11-01 NOTE — ED Provider Notes (Signed)
Medical screening examination/treatment/procedure(s) were performed by non-physician practitioner and as supervising physician I was immediately available for consultation/collaboration.  EKG Interpretation   None        Ethelda Chick, MD 11/01/13 1050

## 2013-11-01 NOTE — ED Provider Notes (Signed)
CSN: [id number]     Arrival date & time 11/01/13  8413 History   First MD Initiated Contact with Patient 11/01/13 [id number]     Chief Complaint  Patient presents with  . Cough   (Consider location/radiation/quality/duration/timing/severity/associated sxs/prior Treatment) HPI Comments: Patient with history of asthma on Qvar and Dulera -- presents with complaint of cough for one week, worse over 3 days. Cough is nonproductive. It is associated with nasal congestion, rhinorrhea, sinus pain. She complains of congestion in her chest. She has taken Mucinex with mild relief. No fever, ear pain, sore throat, vomiting. No chest pain, abdominal pain, urinary symptoms. No other treatments prior to arrival. Patient has used Tessalon in the past for cough. She does have some wheezing especially at night. Onset of symptoms gradual. Course is constant. She has had flu shot.   Patient is a 62 y.o. female presenting with cough. The history is provided by the patient.  Cough Associated symptoms: rhinorrhea and wheezing   Associated symptoms: no chest pain, no chills, no ear pain, no fever, no headaches, no myalgias, no rash, no shortness of breath and no sore throat     Past Medical History  Diagnosis Date  . Hyperlipidemia   . Urge urinary incontinence   . OA (osteoarthritis)   . Mild asthma   . Hypertension    Past Surgical History  Procedure Laterality Date  . Nasal sinus surgery  1990'S  . Excision lipomas of back and right thigh  11-28-2011  . Nasal septum surgery  2012  . Cardiovascular stress test  12-23-2007    NORMAL NUCLEAR STUDY  . Cystoscopy with injection N/A 06/25/2013    Procedure: CYSTOSCOPY WITH BOTOX INJECTION;  Surgeon: Martina Sinner, MD;  Location: [facility];  Service: Urology;  Laterality: N/A;   No family history on file. History  Substance Use Topics  . Smoking status: Never Smoker   . Smokeless tobacco: Never Used  . Alcohol Use: Yes     Comment:  OCCASIONAL   OB History   Grav Para Term Preterm Abortions TAB SAB Ect Mult Living                 Review of Systems  Constitutional: Negative for fever, chills and fatigue.  HENT: Positive for congestion, rhinorrhea and sinus pressure. Negative for ear pain and sore throat.   Eyes: Negative for redness.  Respiratory: Positive for cough and wheezing. Negative for shortness of breath.   Cardiovascular: Negative for chest pain.  Gastrointestinal: Negative for nausea, vomiting, abdominal pain and diarrhea.  Genitourinary: Negative for dysuria.  Musculoskeletal: Negative for myalgias and neck stiffness.  Skin: Negative for rash.  Neurological: Negative for headaches.  Hematological: Negative for adenopathy.    Allergies  Penicillins and Morphine and related  Home Medications   Current Outpatient Rx  Name  Route  Sig  Dispense  Refill  . beclomethasone (QVAR) 80 MCG/ACT inhaler   Inhalation   Inhale 2 puffs into the lungs 2 (two) times daily.         . Biotin 1000 MCG tablet   Oral   Take 1,000 mcg by mouth daily.         . Cholecalciferol (VITAMIN D3) 5000 UNITS CAPS   Oral   Take 5,000 Units by mouth daily.          Elwin Sleight 100-5 MCG/ACT AERO   Inhalation   Inhale 2 puffs into the lungs 2 (two) times daily.          Marland Kitchen  Glucosamine-Chondroit-Vit C-Mn (GLUCOSAMINE 1500 COMPLEX) CAPS   Oral   Take 1 capsule by mouth daily.          Marland Kitchen lisinopril (PRINIVIL,ZESTRIL) 10 MG tablet   Oral   Take 10 mg by mouth daily.         . Omega-3 Fatty Acids (FISH OIL) 1200 MG CAPS   Oral   Take 1,200 mg by mouth daily.          Marland Kitchen venlafaxine XR (EFFEXOR-XR) 75 MG 24 hr capsule   Oral   Take 75 mg by mouth daily.          BP 142/81  Pulse 100  Temp(Src) 97.7 F (36.5 C) (Oral)  Resp 18  SpO2 96% Physical Exam  Nursing note and vitals reviewed. Constitutional: She appears well-developed and well-nourished.  HENT:  Head: Normocephalic and atraumatic.   Right Ear: Tympanic membrane, external ear and ear canal normal.  Left Ear: Tympanic membrane, external ear and ear canal normal.  Nose: Mucosal edema present. No rhinorrhea.  Mouth/Throat: Oropharynx is clear and moist. No oropharyngeal exudate, posterior oropharyngeal edema, posterior oropharyngeal erythema or tonsillar abscesses.  Eyes: Conjunctivae are normal. Right eye exhibits no discharge. Left eye exhibits no discharge.  Neck: Normal range of motion. Neck supple.  Cardiovascular: Normal rate, regular rhythm and normal heart sounds.   No murmur heard. Pulmonary/Chest: Effort normal. She has wheezes (Mild, expiratory, scattered). She has rhonchi (all fields). She has no rales.  Abdominal: Soft. There is no tenderness.  Neurological: She is alert.  Skin: Skin is warm and dry.  Psychiatric: She has a normal mood and affect.    ED Course  Procedures (including critical care time) Labs Review Labs Reviewed - No data to display Imaging Review No results found.  EKG Interpretation   None      10:10 AM Patient seen and examined. Medications ordered. Will give breathing treatment, prednisone. Anticipate discharge home with symptomatic care.  Vital signs reviewed and are as follows: Filed Vitals:   11/01/13 0901  BP: 142/81  Pulse: 100  Temp: 97.7 F (36.5 C)  Resp: 18   10:44 AM Wheezing improved after breathing treatment, patient continues to have coughing.  Will discharge to home with prednisone, albuterol, Tessalon. Patient does not have inhaled SABA at home.  Patient counseled on use of albuterol HFA.  Told to use 1-2 puffs q 4 hours as needed for SOB.  Patient counseled on supportive care for viral illness and s/s to return including worsening symptoms, persistent fever, persistent vomiting, or if they have any other concerns.  Urged to see PCP if symptoms persist for more than 3 days. Patient verbalizes understanding and agrees with plan.     MDM   1. Cough     Patient with increasing cough. Suspect viral bronchitis. No fever, shortness of breath, or chest pain to suggest pneumonia. Do not feel imaging is indicated at this point. Vital signs are normal. Patient appears well although coughing. Symptomatic treatment indicated at this time.    Renne Crigler, PA-C 11/01/13 1046

## 2013-11-10 ENCOUNTER — Encounter: Payer: Self-pay | Admitting: Gynecology

## 2013-11-13 ENCOUNTER — Ambulatory Visit: Payer: Self-pay | Admitting: Gynecology

## 2013-12-11 ENCOUNTER — Ambulatory Visit: Payer: Self-pay | Admitting: Gynecology

## 2013-12-18 ENCOUNTER — Ambulatory Visit: Payer: Self-pay | Admitting: Obstetrics & Gynecology

## 2013-12-18 ENCOUNTER — Other Ambulatory Visit: Payer: Self-pay | Admitting: *Deleted

## 2013-12-18 MED ORDER — VENLAFAXINE HCL ER 75 MG PO CP24: 75.0000 mg | ORAL_CAPSULE | Freq: Every day | ORAL | Status: DC

## 2013-12-18 NOTE — Telephone Encounter (Signed)
Last AEX 11/12/2012 Last refill 12/22/2012 #90/3 refills Next appt 02/12/2014.  Will refill until appt.

## 2014-02-12 ENCOUNTER — Ambulatory Visit: Payer: Self-pay | Admitting: Obstetrics & Gynecology

## 2014-05-20 ENCOUNTER — Encounter: Payer: Self-pay | Admitting: Family Medicine

## 2014-05-20 ENCOUNTER — Ambulatory Visit (INDEPENDENT_AMBULATORY_CARE_PROVIDER_SITE_OTHER): Payer: 59 | Admitting: Family Medicine

## 2014-05-20 VITALS — BP 124/82 | HR 92 | Wt 174.0 lb

## 2014-05-20 DIAGNOSIS — D1720 Benign lipomatous neoplasm of skin and subcutaneous tissue of unspecified limb: Secondary | ICD-10-CM

## 2014-05-20 DIAGNOSIS — Z2911 Encounter for prophylactic immunotherapy for respiratory syncytial virus (RSV): Secondary | ICD-10-CM

## 2014-05-20 DIAGNOSIS — D1779 Benign lipomatous neoplasm of other sites: Secondary | ICD-10-CM

## 2014-05-20 DIAGNOSIS — Z23 Encounter for immunization: Secondary | ICD-10-CM

## 2014-05-20 NOTE — Progress Notes (Signed)
   Subjective:    Patient ID: Maureen Love, female    DOB: [birth year], 63 y.o.   MRN: [id number]  HPI She is here for evaluation of a lesion on her right shoulder. She has a history of previous removal of a lipoma from this area and noted recently that it is starting to grow again. She has not been seen here in several years. She gets most of her care through her gynecologist. Her record was reviewed.  Review of Systems     Objective:   Physical Exam Alert and in no distress. Right shoulder shows a 4 cm surgical scar on the posterior aspect of the shoulder with a 3 cm round smooth movable lesion inferior to the scar Review of her immunization record indicates a need for Zostavax      Assessment & Plan:  Need for prophylactic vaccination and inoculation against other viral diseases(V04.89)  Lipoma of arm  S. treatment of a lipoma. Recommend she wait to see if it grows anymore in and decide whether she wants to have another surgical procedure. A mechanically is not giving her any trouble. Zostavax given. Discussed getting the rest of her medical care through me since she is almost 25 and will therefore no longer needs Pap's.

## 2014-06-29 ENCOUNTER — Ambulatory Visit (INDEPENDENT_AMBULATORY_CARE_PROVIDER_SITE_OTHER): Payer: 59 | Admitting: Family Medicine

## 2014-06-29 ENCOUNTER — Encounter: Payer: Self-pay | Admitting: Family Medicine

## 2014-06-29 VITALS — BP 122/78 | HR 84 | Wt 170.0 lb

## 2014-06-29 DIAGNOSIS — J45901 Unspecified asthma with (acute) exacerbation: Secondary | ICD-10-CM

## 2014-06-29 DIAGNOSIS — Z8639 Personal history of other endocrine, nutritional and metabolic disease: Secondary | ICD-10-CM

## 2014-06-29 DIAGNOSIS — Z23 Encounter for immunization: Secondary | ICD-10-CM

## 2014-06-29 DIAGNOSIS — J4531 Mild persistent asthma with (acute) exacerbation: Secondary | ICD-10-CM

## 2014-06-29 DIAGNOSIS — J45909 Unspecified asthma, uncomplicated: Secondary | ICD-10-CM | POA: Insufficient documentation

## 2014-06-29 MED ORDER — MOMETASONE FURO-FORMOTEROL FUM 200-5 MCG/ACT IN AERO: 2.0000 | INHALATION_SPRAY | Freq: Two times a day (BID) | RESPIRATORY_TRACT | Status: DC

## 2014-06-29 MED ORDER — HYDROCODONE-HOMATROPINE 5-1.5 MG/5ML PO SYRP: 5.0000 mL | ORAL_SOLUTION | Freq: Three times a day (TID) | ORAL | Status: DC | PRN

## 2014-06-29 NOTE — Progress Notes (Signed)
   Subjective:    Patient ID: Maureen Love, female    DOB: 11-12-1950, 63 y.o.   MRN: [id number]  HPI She has a two-day history of difficulty with a dry cough, nausea, headache, fatigue but no fever, chills, sore throat, earache. She did have recent exposure to her husband who apparently had a viral illness. She does have underlying allergies and continues on the medications listed in the chart. She does get some slight shortness of breath especially at night. She has not been using her albuterol inhaler. She has had difficulty in the past with this and did respond fairly well to codeine. She also has a previous history of vitamin D deficiency and states she is now on 5000 units per day of vitamin D.   Review of Systems     Objective:   Physical Exam alert and in no distress. Tympanic membranes and canals are normal. Throat is clear. Tonsils are normal. Neck is supple without adenopathy or thyromegaly. Cardiac exam shows a regular sinus rhythm without murmurs or gallops. Lungs are clear to auscultation.        Assessment & Plan:  Asthma with acute exacerbation, mild persistent - Plan: Comprehensive metabolic panel, HYDROcodone-homatropine (HYCODAN) 5-1.5 MG/5ML syrup, mometasone-formoterol (DULERA) 200-5 MCG/ACT AERO  History of vitamin D deficiency - Plan: Vit D  25 hydroxy (rtn osteoporosis monitoring), Comprehensive metabolic panel  Need for prophylactic vaccination and inoculation against influenza - Plan: Flu Vaccine QUAD 36+ mos IM  Need for prophylactic vaccination against Streptococcus pneumoniae (pneumococcus) - Plan: Pneumococcal conjugate vaccine 13-valent  I gave her a sample of Dulera 200/5. Also instructed her to use the albuterol inhaler and only use the codeine as a last resort. I will also check her vitamin D level and complete metabolic.

## 2014-06-29 NOTE — Patient Instructions (Signed)
Use Dulera that I gave you. 2 puffs of the albuterol every 4 hours as needed.Marland Kitchen a cough suppressant as last on the last

## 2014-06-30 ENCOUNTER — Telehealth: Payer: Self-pay | Admitting: Internal Medicine

## 2014-06-30 ENCOUNTER — Other Ambulatory Visit: Payer: Self-pay | Admitting: Internal Medicine

## 2014-06-30 LAB — COMPREHENSIVE METABOLIC PANEL
ALT: 25 U/L (ref 0–35)
AST: 20 U/L (ref 0–37)
Albumin: 4.3 g/dL (ref 3.5–5.2)
Alkaline Phosphatase: 48 U/L (ref 39–117)
BUN: 16 mg/dL (ref 6–23)
CO2: 26 mEq/L (ref 19–32)
Calcium: 8.8 mg/dL (ref 8.4–10.5)
Chloride: 104 mEq/L (ref 96–112)
Creat: 1 mg/dL (ref 0.50–1.10)
Glucose, Bld: 131 mg/dL — ABNORMAL HIGH (ref 70–99)
Potassium: 3.7 mEq/L (ref 3.5–5.3)
Sodium: 139 mEq/L (ref 135–145)
Total Bilirubin: 0.4 mg/dL (ref 0.2–1.2)
Total Protein: 6.4 g/dL (ref 6.0–8.3)

## 2014-06-30 LAB — VITAMIN D 25 HYDROXY (VIT D DEFICIENCY, FRACTURES): Vit D, 25-Hydroxy: 88 ng/mL (ref 30–89)

## 2014-06-30 NOTE — Telephone Encounter (Signed)
Per jcl- labs look fine but would like her to come in for a finger stick A1C as a nurse visit

## 2014-06-30 NOTE — Telephone Encounter (Signed)
Pt will come in Wednesday sept 16th for flu and pneumonia shot as well as in house A1c

## 2014-07-06 ENCOUNTER — Telehealth: Payer: Self-pay | Admitting: Family Medicine

## 2014-07-06 DIAGNOSIS — J4531 Mild persistent asthma with (acute) exacerbation: Secondary | ICD-10-CM

## 2014-07-06 MED ORDER — HYDROCODONE-HOMATROPINE 5-1.5 MG/5ML PO SYRP: 5.0000 mL | ORAL_SOLUTION | Freq: Three times a day (TID) | ORAL | Status: DC | PRN

## 2014-07-06 NOTE — Telephone Encounter (Signed)
Pt will come by and pick up cough syrup rx to take to pharmacy

## 2014-07-06 NOTE — Telephone Encounter (Signed)
Pt called and stated that she was better but still coughing especially at night. She is requesting a refill on the cough medication. Pt can be reached at [phone] when ready.

## 2014-07-06 NOTE — Telephone Encounter (Signed)
Okay to renew

## 2014-07-21 ENCOUNTER — Other Ambulatory Visit (INDEPENDENT_AMBULATORY_CARE_PROVIDER_SITE_OTHER): Payer: 59

## 2014-07-21 DIAGNOSIS — R7302 Impaired glucose tolerance (oral): Secondary | ICD-10-CM

## 2014-07-21 DIAGNOSIS — Z23 Encounter for immunization: Secondary | ICD-10-CM

## 2014-07-21 DIAGNOSIS — R7309 Other abnormal glucose: Secondary | ICD-10-CM

## 2014-07-21 LAB — POCT GLYCOSYLATED HEMOGLOBIN (HGB A1C): Hemoglobin A1C: 5.2

## 2014-07-21 NOTE — Progress Notes (Signed)
PT IS AWARE OF THIS

## 2014-08-05 ENCOUNTER — Encounter: Payer: Self-pay | Admitting: Family Medicine

## 2014-08-05 ENCOUNTER — Ambulatory Visit (INDEPENDENT_AMBULATORY_CARE_PROVIDER_SITE_OTHER): Payer: 59 | Admitting: Family Medicine

## 2014-08-05 VITALS — BP 120/70 | HR 80 | Wt 170.0 lb

## 2014-08-05 DIAGNOSIS — J4530 Mild persistent asthma, uncomplicated: Secondary | ICD-10-CM

## 2014-08-05 DIAGNOSIS — L309 Dermatitis, unspecified: Secondary | ICD-10-CM

## 2014-08-05 DIAGNOSIS — K219 Gastro-esophageal reflux disease without esophagitis: Secondary | ICD-10-CM

## 2014-08-05 DIAGNOSIS — T2220XA Burn of second degree of shoulder and upper limb, except wrist and hand, unspecified site, initial encounter: Secondary | ICD-10-CM

## 2014-08-05 MED ORDER — MOMETASONE FURO-FORMOTEROL FUM 200-5 MCG/ACT IN AERO: 2.0000 | INHALATION_SPRAY | Freq: Two times a day (BID) | RESPIRATORY_TRACT | Status: DC

## 2014-08-05 NOTE — Patient Instructions (Addendum)
Keep track of the foods GE lead that you know will cause indigestion and you either avoid them or you premedicate yourself with Prilosec I will give you a new Willeen Niece and I want this to work such that you don't need to use your inhaler more than twice a week

## 2014-08-05 NOTE — Progress Notes (Signed)
   Subjective:    Patient ID: Maureen Love, female    DOB: 02-02-1951, 63 y.o.   MRN: [id number]  HPI She is here for consult concerning continued difficulty with coughing. She has had this for the last several years. She continues on her inhalers on a regular daily basis. She did stop an ACE inhibitor which did help her cough slightly. She notes increase in her coughing and wheezing in the fall and winter. She also notes difficulty with breathing when she has indigestion and so far she can relate indigestion to eating pizza. She did note that Prilosec help with the indigestion and therefore her breathing.  She is really not paid any attention to this in the past. She notes no change in position with her breathing. She also complains of a rash in both forearm area. She recently did have a burn to her right arm.   Review of Systems     Objective:   Physical Exam Alert and in no distress. Cardiac exam shows regular rhythm without murmurs or gallops. Lungs are clear to auscultation. Slight erythema and dryness is noted in both antecubital fossa. Healing burn is noted on the right wrist area. Second-degree burn with good healing and dryness is noted to the right wrist area ventral surface.      Assessment & Plan:  Gastroesophageal reflux disease without esophagitis  Asthma in adult, mild persistent, uncomplicated - Plan: mometasone-formoterol (DULERA) 200-5 MCG/ACT AERO  Eczema  Second degree burn of arm, initial encounter  discussed keeping track of her food intake to see what causes indigestion and either avoid or use Prilosec for this. I will increase her Dulera. Discussed the fact that I want her to be able to cut back on the use of albuterol to just twice per week. Recommend cortisone cream for the eczema. No therapy for the burn since it seems to be healing quite nicely.

## 2014-09-06 ENCOUNTER — Encounter: Payer: Self-pay | Admitting: Family Medicine

## 2014-09-08 ENCOUNTER — Ambulatory Visit: Payer: 59 | Admitting: Family Medicine

## 2014-10-07 ENCOUNTER — Telehealth: Payer: Self-pay | Admitting: Internal Medicine

## 2014-10-07 DIAGNOSIS — J4530 Mild persistent asthma, uncomplicated: Secondary | ICD-10-CM

## 2014-10-07 MED ORDER — MOMETASONE FURO-FORMOTEROL FUM 200-5 MCG/ACT IN AERO: 2.0000 | INHALATION_SPRAY | Freq: Two times a day (BID) | RESPIRATORY_TRACT | Status: DC

## 2014-10-07 NOTE — Telephone Encounter (Signed)
Pt came in needing rx of dulera 200mg  [facility]

## 2014-11-05 HISTORY — PX: NASAL SEPTUM SURGERY: SHX37

## 2014-11-22 ENCOUNTER — Other Ambulatory Visit: Payer: Self-pay | Admitting: Urology

## 2014-12-06 ENCOUNTER — Encounter (HOSPITAL_BASED_OUTPATIENT_CLINIC_OR_DEPARTMENT_OTHER): Payer: Self-pay | Admitting: *Deleted

## 2014-12-09 ENCOUNTER — Encounter (HOSPITAL_BASED_OUTPATIENT_CLINIC_OR_DEPARTMENT_OTHER): Payer: Self-pay | Admitting: *Deleted

## 2014-12-09 NOTE — Progress Notes (Signed)
Pt instructed npo p mn x welbutrin w sip of water.pt to bring imitrex w her.  To [place] [address].  Needs hgb on arrival.

## 2014-12-13 NOTE — H&P (Signed)
History of Present Illness   Maureen. Maureen Love for over 6 months had almost complete resolution of her urinary frequency and urge incontinence. In the last 3 weeks or so, without any symptoms of burning or cloudy or foul-smelling urine, she has had acute recurrence of her urge incontinence and frequency.      I thought next time it would be nice to perform it under IV sedation since it was quite uncomfortable.    Maureen Love, in May of 2014 she had had Botox. I was going to give her Vicodin prior, but we eventually had to do it under anesthesia in August 2014.  Review of Systems: No change in bowel or neurologic systems.   Frequency is stable.    Past Medical History Problems  1. History of Anxiety (F41.9) 2. History of Arthritis 3. History of Asthma (J45.909) 4. History of depression (Z86.59) 5. History of esophageal reflux (Z87.19) 6. History of hypercholesterolemia (Z86.39) 7. History of hypertension (Z86.79)  Surgical History Problems  1. History of Bladder Surgery 2. History of Cystoscopy For Urethral Stricture 3. History of Foot Surgery 4. History of Rhinoplasty 5. History of Urologic Surgery  Current Meds 1. Azelastine HCl - 0.1 % Nasal Solution;  Therapy: [id number] to Recorded 2. Biotin TABS;  Therapy: (WNUUVOZD:66YQI3474) to Recorded 3. Dulera AERO;  Therapy: (QVZDGLOV:56EPP2951) to Recorded 4. EpiPen 2-Pak 0.3 MG/0.3ML DEVI;  Therapy: 88CZY6063 to Recorded 5. Fish Oil CAPS;  Therapy: (Recorded:15Mar2013) to Recorded 6. Glucosamine TABS;  Therapy: (KZSWFUXN:23FTD3220) to Recorded 7. Juice Plus Fibre LIQD;  Therapy: (Recorded:15Mar2013) to Recorded 8. Lisinopril 10 MG Oral Tablet;  Therapy: 25KYH0623 to Recorded 9. Myrbetriq 50 MG Oral Tablet Extended Release 24 Hour; Take 1 tablet daily;  Therapy: 76EGB1517 to (Evaluate:04May2014); Last OH:60VPX1062 Ordered 10. Nasonex SUSP;   Therapy: (Recorded:15Mar2013) to Recorded 11. Pataday 0.2 % Ophthalmic Solution;  Therapy: [phone] to Recorded 12. Vitamin D3 CAPS;   Therapy: (Recorded:15Mar2013) to Recorded  Allergies Medication  1. Morphine Derivatives 2. Penicillins  Family History Problems  1. Family history of Cardiac Arrest : Paternal Aunt 2. Family history of Cardiac Arrest : Paternal Uncle 3. Family history of Cardiac Arrest : Maternal Aunt 4. Family history of Death In The Family Father : Father   father passed @ age 72stroke 86. Family history of Family Health Status Number Of Children   1 deceased son @ age 38heart attack 33. Family history of Migraine Headache : Mother 64. Family history of Migraine Headache : Brother 8. Family history of Migraine Headache : Sister 15. Family history of Stroke Syndrome : Father  Social History Problems  1. Alcohol Use   one drink weekly 2. Caffeine Use   3 drinks a week 3. Marital History - Currently Married 4. Never A Smoker 5. Occupation: Retired 69. Denied: History of Tobacco Use  Vitals Vital Signs [Data Includes: Last 1 Day]  Recorded: [id number] 03:30PM  Height: 5 ft 1 in Weight: 167 lb  BMI Calculated: 31.55 BSA Calculated: 1.75 Blood Pressure: 139 / 78 Temperature: 97.9 F Heart Rate: 92  Assessment Assessed  1. Increased urinary frequency (R35.0) 2. Urge incontinence of urine (N39.41)  Plan  Urge incontinence of urine  1. Follow-up Schedule Surgery Office  Follow-up  Status: Complete  Done: [id number]  Discussion/Summary   Maureen Love has had 2 Botox under anesthesia. One worked 9 months and one was 65. She said it immediately started wearing off. She is clinically not infected. Her incontinence has returned. There are no other modifying factors  or associated signs or symptoms. There are no other aggravating or relieving factors. Her presentation is moderate in severity and ongoing.  After a thorough review of the management options for the patient's condition the patient  elected to proceed with surgical therapy as  noted above. We have discussed the potential benefits and risks of the procedure, side effects of the proposed treatment, the likelihood of the patient achieving the goals of the procedure, and any potential problems that might occur during the procedure or recuperation. Informed consent has been obtained.

## 2014-12-14 ENCOUNTER — Ambulatory Visit (HOSPITAL_BASED_OUTPATIENT_CLINIC_OR_DEPARTMENT_OTHER): Payer: 59 | Admitting: Anesthesiology

## 2014-12-14 ENCOUNTER — Encounter (HOSPITAL_BASED_OUTPATIENT_CLINIC_OR_DEPARTMENT_OTHER)
Admission: RE | Disposition: A | Discharge status: Home / Self Care | Payer: Self-pay | Source: Ambulatory Visit | Attending: Urology

## 2014-12-14 ENCOUNTER — Ambulatory Visit (HOSPITAL_BASED_OUTPATIENT_CLINIC_OR_DEPARTMENT_OTHER)
Admission: RE | Admit: 2014-12-14 | Discharge: 2014-12-14 | Disposition: A | Discharge status: Home / Self Care | Payer: 59 | Source: Ambulatory Visit | Attending: Urology | Admitting: Urology

## 2014-12-14 ENCOUNTER — Encounter (HOSPITAL_BASED_OUTPATIENT_CLINIC_OR_DEPARTMENT_OTHER): Payer: Self-pay | Admitting: *Deleted

## 2014-12-14 DIAGNOSIS — Z79899 Other long term (current) drug therapy: Secondary | ICD-10-CM | POA: Insufficient documentation

## 2014-12-14 DIAGNOSIS — R350 Frequency of micturition: Secondary | ICD-10-CM | POA: Insufficient documentation

## 2014-12-14 DIAGNOSIS — N3289 Other specified disorders of bladder: Secondary | ICD-10-CM | POA: Insufficient documentation

## 2014-12-14 DIAGNOSIS — K219 Gastro-esophageal reflux disease without esophagitis: Secondary | ICD-10-CM | POA: Diagnosis not present

## 2014-12-14 DIAGNOSIS — M1990 Unspecified osteoarthritis, unspecified site: Secondary | ICD-10-CM | POA: Diagnosis not present

## 2014-12-14 DIAGNOSIS — J45909 Unspecified asthma, uncomplicated: Secondary | ICD-10-CM | POA: Insufficient documentation

## 2014-12-14 DIAGNOSIS — N8110 Cystocele, unspecified: Secondary | ICD-10-CM | POA: Insufficient documentation

## 2014-12-14 DIAGNOSIS — J449 Chronic obstructive pulmonary disease, unspecified: Secondary | ICD-10-CM | POA: Diagnosis not present

## 2014-12-14 DIAGNOSIS — Z9889 Other specified postprocedural states: Secondary | ICD-10-CM | POA: Insufficient documentation

## 2014-12-14 DIAGNOSIS — N3281 Overactive bladder: Secondary | ICD-10-CM | POA: Insufficient documentation

## 2014-12-14 DIAGNOSIS — Z6832 Body mass index (BMI) 32.0-32.9, adult: Secondary | ICD-10-CM | POA: Insufficient documentation

## 2014-12-14 DIAGNOSIS — F329 Major depressive disorder, single episode, unspecified: Secondary | ICD-10-CM | POA: Diagnosis not present

## 2014-12-14 DIAGNOSIS — E780 Pure hypercholesterolemia: Secondary | ICD-10-CM | POA: Diagnosis not present

## 2014-12-14 DIAGNOSIS — I10 Essential (primary) hypertension: Secondary | ICD-10-CM | POA: Diagnosis not present

## 2014-12-14 DIAGNOSIS — N3941 Urge incontinence: Secondary | ICD-10-CM | POA: Insufficient documentation

## 2014-12-14 HISTORY — DX: Dermatitis, unspecified: L30.9

## 2014-12-14 HISTORY — PX: CYSTOSCOPY WITH INJECTION: SHX1424

## 2014-12-14 SURGERY — CYSTOSCOPY, WITH INJECTION OF BLADDER NECK OR BLADDER WALL
Anesthesia: General | Site: Bladder

## 2014-12-14 MED ORDER — PROPOFOL 10 MG/ML IV BOLUS
INTRAVENOUS | Status: DC | PRN
  Administered 2014-12-14: 100 mg via INTRAVENOUS

## 2014-12-14 MED ORDER — ONDANSETRON HCL 4 MG/2ML IJ SOLN
INTRAMUSCULAR | Status: DC | PRN
  Administered 2014-12-14: 4 mg via INTRAVENOUS

## 2014-12-14 MED ORDER — STERILE WATER FOR IRRIGATION IR SOLN
Status: DC | PRN
Start: 2014-12-14 — End: 2014-12-14
  Administered 2014-12-14: 3000 mL

## 2014-12-14 MED ORDER — FENTANYL CITRATE 0.05 MG/ML IJ SOLN
INTRAMUSCULAR | Status: AC
  Filled 2014-12-14: qty 2

## 2014-12-14 MED ORDER — FENTANYL CITRATE 0.05 MG/ML IJ SOLN
25.0000 ug | INTRAMUSCULAR | Status: DC | PRN
  Filled 2014-12-14: qty 1

## 2014-12-14 MED ORDER — HYDROCODONE-ACETAMINOPHEN 5-325 MG PO TABS: 1.0000 | ORAL_TABLET | Freq: Four times a day (QID) | ORAL | Status: DC | PRN

## 2014-12-14 MED ORDER — MEPERIDINE HCL 25 MG/ML IJ SOLN
6.2500 mg | INTRAMUSCULAR | Status: DC | PRN
  Filled 2014-12-14: qty 1

## 2014-12-14 MED ORDER — PROPOFOL 10 MG/ML IV EMUL
INTRAVENOUS | Status: DC | PRN
  Administered 2014-12-14: 50 ug/kg/min via INTRAVENOUS

## 2014-12-14 MED ORDER — FENTANYL CITRATE 0.05 MG/ML IJ SOLN
INTRAMUSCULAR | Status: DC | PRN
  Administered 2014-12-14: 50 ug via INTRAVENOUS

## 2014-12-14 MED ORDER — SODIUM CHLORIDE 0.9 % IJ SOLN
INTRAMUSCULAR | Status: DC | PRN
  Administered 2014-12-14: 10 mL

## 2014-12-14 MED ORDER — MIDAZOLAM HCL 5 MG/5ML IJ SOLN
INTRAMUSCULAR | Status: DC | PRN
  Administered 2014-12-14: 2 mg via INTRAVENOUS

## 2014-12-14 MED ORDER — CIPROFLOXACIN IN D5W 400 MG/200ML IV SOLN
INTRAVENOUS | Status: AC
  Filled 2014-12-14: qty 200

## 2014-12-14 MED ORDER — ONABOTULINUMTOXINA 100 UNITS IJ SOLR
INTRAMUSCULAR | Status: DC | PRN
Start: 2014-12-14 — End: 2014-12-14
  Administered 2014-12-14: 100 [IU] via INTRAMUSCULAR

## 2014-12-14 MED ORDER — LIDOCAINE HCL (CARDIAC) 20 MG/ML IV SOLN
INTRAVENOUS | Status: DC | PRN
  Administered 2014-12-14: 50 mg via INTRAVENOUS

## 2014-12-14 MED ORDER — MIDAZOLAM HCL 2 MG/2ML IJ SOLN
INTRAMUSCULAR | Status: AC
  Filled 2014-12-14: qty 2

## 2014-12-14 MED ORDER — CIPROFLOXACIN HCL 250 MG PO TABS: 250.0000 mg | ORAL_TABLET | Freq: Two times a day (BID) | ORAL | Status: DC

## 2014-12-14 MED ORDER — MIDAZOLAM HCL 2 MG/2ML IJ SOLN
0.5000 mg | Freq: Once | INTRAMUSCULAR | Status: DC | PRN
  Filled 2014-12-14: qty 2

## 2014-12-14 MED ORDER — LACTATED RINGERS IV SOLN
INTRAVENOUS | Status: DC
  Administered 2014-12-14: 07:00:00 via INTRAVENOUS
  Filled 2014-12-14: qty 1000

## 2014-12-14 MED ORDER — CIPROFLOXACIN IN D5W 400 MG/200ML IV SOLN
400.0000 mg | INTRAVENOUS | Status: DC
  Filled 2014-12-14: qty 200

## 2014-12-14 MED ORDER — PROMETHAZINE HCL 25 MG/ML IJ SOLN
6.2500 mg | INTRAMUSCULAR | Status: DC | PRN
  Filled 2014-12-14: qty 1

## 2014-12-14 SURGICAL SUPPLY — 22 items
BAG DRAIN URO-CYSTO SKYTR STRL (DRAIN) ×2 IMPLANT
BAG DRN UROCATH (DRAIN) ×1
CANISTER SUCT LVC 12 LTR MEDI- (MISCELLANEOUS) ×1 IMPLANT
CATH ROBINSON RED A/P 12FR (CATHETERS) IMPLANT
CATH ROBINSON RED A/P 14FR (CATHETERS) ×1 IMPLANT
CLOTH BEACON ORANGE TIMEOUT ST (SAFETY) ×2 IMPLANT
DRAPE CAMERA CLOSED 9X96 (DRAPES) ×2 IMPLANT
ELECT REM PT RETURN 9FT ADLT (ELECTROSURGICAL)
ELECTRODE REM PT RTRN 9FT ADLT (ELECTROSURGICAL) IMPLANT
GLOVE BIO SURGEON STRL SZ 6.5 (GLOVE) ×1 IMPLANT
GLOVE BIO SURGEON STRL SZ7.5 (GLOVE) ×3 IMPLANT
GLOVE INDICATOR 6.5 STRL GRN (GLOVE) ×1 IMPLANT
GLOVE SURG SS PI 7.5 STRL IVOR (GLOVE) ×2 IMPLANT
GOWN PREVENTION PLUS LG XLONG (DISPOSABLE) ×1 IMPLANT
GOWN STRL REIN XL XLG (GOWN DISPOSABLE) ×1 IMPLANT
GOWN STRL REUS W/TWL XL LVL3 (GOWN DISPOSABLE) ×3 IMPLANT
NDL SAFETY ECLIPSE 18X1.5 (NEEDLE) ×1 IMPLANT
NEEDLE HYPO 18GX1.5 SHARP (NEEDLE) ×2
PACK CYSTO (CUSTOM PROCEDURE TRAY) ×2 IMPLANT
SYR 20CC LL (SYRINGE) ×2 IMPLANT
SYR BULB IRRIGATION 50ML (SYRINGE) IMPLANT
WATER STERILE IRR 3000ML UROMA (IV SOLUTION) ×2 IMPLANT

## 2014-12-14 NOTE — Interval H&P Note (Signed)
History and Physical Interval Note:  12/14/2014 7:01 AM  Maureen Love  has presented today for surgery, with the diagnosis of URGENT INCONTENENCE  The various methods of treatment have been discussed with the patient and family. After consideration of risks, benefits and other options for treatment, the patient has consented to  Procedure(s): CYSTOSCOPY WITH BOTOX  INJECTION (N/A) as a surgical intervention .  The patient's history has been reviewed, patient examined, no change in status, stable for surgery.  I have reviewed the patient's chart and labs.  Questions were answered to the patient's satisfaction.     MACDIARMID,SCOTT A

## 2014-12-14 NOTE — Discharge Instructions (Signed)
I have reviewed discharge instructions in detail with the patient. They will follow-up with me or their physician as scheduled. My nurse will also be calling the patients as per protocol.    CYSTOSCOPY HOME CARE INSTRUCTIONS  Activity: Rest for the remainder of the day.  Do not drive or operate equipment today.  You may resume normal activities in one to two days as instructed by your physician.   Meals: Drink plenty of liquids and eat light foods such as gelatin or soup this evening.  You may return to a normal meal plan tomorrow.  Return to Work: You may return to work in one to two days or as instructed by your physician.  Special Instructions / Symptoms: Call your physician if any of these symptoms occur:   -persistent or heavy bleeding  -bleeding which continues after first few urination  -large blood clots that are difficult to pass  -urine stream diminishes or stops completely  -fever equal to or higher than 101 degrees Farenheit.  -cloudy urine with a strong, foul odor  -severe pain  Females should always wipe from front to back after elimination.  You may feel some burning pain when you urinate.  This should disappear with time.  Applying moist heat to the lower abdomen or a hot tub bath may help relieve the pain. \  Follow-Up / Date of Return Visit to Your Physician:  Call for an appointment to arrange follow-up.  Patient Signature:  ________________________________________________________  Nurse's Signature:  ________________________________________________________  Post Anesthesia Home Care Instructions  Activity: Get plenty of rest for the remainder of the day. A responsible adult should stay with you for 24 hours following the procedure.  For the next 24 hours, DO NOT: -Drive a car -Paediatric nurse -Drink alcoholic beverages -Take any medication unless instructed by your physician -Make any legal decisions or sign important papers.  Meals: Start with  liquid foods such as gelatin or soup. Progress to regular foods as tolerated. Avoid greasy, spicy, heavy foods. If nausea and/or vomiting occur, drink only clear liquids until the nausea and/or vomiting subsides. Call your physician if vomiting continues.  Special Instructions/Symptoms: Your throat may feel dry or sore from the anesthesia or the breathing tube placed in your throat during surgery. If this causes discomfort, gargle with warm salt water. The discomfort should disappear within 24 hours.

## 2014-12-14 NOTE — Anesthesia Preprocedure Evaluation (Addendum)
Anesthesia Evaluation  Patient identified by MRN, date of birth, ID band Patient awake    Reviewed: Allergy & Precautions, NPO status , Patient's Chart, lab work & pertinent test results  History of Anesthesia Complications Negative for: history of anesthetic complications  Airway Mallampati: I  TM Distance: >3 FB Neck ROM: Full    Dental  (+) Teeth Intact, Dental Advisory Given   Pulmonary asthma , COPD COPD inhaler,  breath sounds clear to auscultation        Cardiovascular hypertension (borderline, no meds), Rhythm:Regular Rate:Normal     Neuro/Psych negative neurological ROS     GI/Hepatic Neg liver ROS, GERD-  Controlled,  Endo/Other  Morbid obesity  Renal/GU negative Renal ROS     Musculoskeletal   Abdominal (+) + obese,   Peds  Hematology negative hematology ROS (+)   Anesthesia Other Findings   Reproductive/Obstetrics                            Anesthesia Physical Anesthesia Plan  ASA: II  Anesthesia Plan: General   Post-op Pain Management:    Induction: Intravenous  Airway Management Planned: LMA  Additional Equipment:   Intra-op Plan:   Post-operative Plan:   Informed Consent: I have reviewed the patients History and Physical, chart, labs and discussed the procedure including the risks, benefits and alternatives for the proposed anesthesia with the patient or authorized representative who has indicated his/her understanding and acceptance.   Dental advisory given  Plan Discussed with: CRNA and Surgeon  Anesthesia Plan Comments: (Plan routine monitors, GA- LMA OK, or MAC)       Anesthesia Quick Evaluation

## 2014-12-14 NOTE — Anesthesia Procedure Notes (Signed)
Date/Time: 12/14/2014 7:52 AM Performed by: Bethena Roys T Oxygen Delivery Method: Simple face mask

## 2014-12-14 NOTE — Progress Notes (Signed)
Dr.Macdiarmid in to see patient.  Ok to go home

## 2014-12-14 NOTE — Anesthesia Postprocedure Evaluation (Signed)
  Anesthesia Post-op Note  Patient: Maureen Love [facility]  Procedure(s) Performed: Procedure(s): CYSTOSCOPY WITH BOTOX  INJECTION (N/A)  Patient Location: PACU  Anesthesia Type:MAC  Level of Consciousness: awake, alert , oriented and patient cooperative  Airway and Oxygen Therapy: Patient Spontanous Breathing  Post-op Pain: none  Post-op Assessment: Post-op Vital signs reviewed, Patient's Cardiovascular Status Stable, Respiratory Function Stable, Patent Airway, No signs of Nausea or vomiting, Adequate PO intake and Pain level controlled  Post-op Vital Signs: Reviewed and stable  Last Vitals:  Filed Vitals:   12/14/14 0846  BP: 149/87  Pulse: 77  Temp:   Resp: 16    Complications: No apparent anesthesia complications

## 2014-12-14 NOTE — Op Note (Signed)
Preoperative diagnosis: Detrusor overactivity and urgency incontinence Postoperative diagnosis: Refractory urgency incontinence and detrusor overactivity Surgery: Cystoscopy and injection of botulinum toxin Surgeon: Dr. Nicki Reaper MacDiarmid  The patient has the above diagnoses and consented the above procedure. The ACMI scope was utilized. She has prolapse as previously noted.  The surgery was done under MAC anesthesia  Preoperative antibiotics were given.  She grade 1 and a 4 bladder trabeculation. There is no cystitis. Trigone was normal. I injected 100 units of Botox 10 mL of injectable saline with my usual template at 5 and 0:76 and cephalad to the interureteric ridge. Trigone was spared. There is no bleeding. Bladder was emptied and patient taken to recovery

## 2014-12-14 NOTE — Transfer of Care (Signed)
Immediate Anesthesia Transfer of Care Note  Patient: Maureen Love [facility]  Procedure(s) Performed: Procedure(s): CYSTOSCOPY WITH BOTOX  INJECTION (N/A)  Patient Location: PACU  Anesthesia Type:MAC  Level of Consciousness: awake and oriented  Airway & Oxygen Therapy: Patient Spontanous Breathing  Post-op Assessment: Report given to RN and Post -op Vital signs reviewed and stable  Post vital signs: Reviewed and stable  Last Vitals:  Filed Vitals:   12/14/14 0613  BP: 150/87  Pulse: 76  Temp: 36.9 C  Resp: 16    Complications: No apparent anesthesia complications

## 2014-12-15 ENCOUNTER — Encounter (HOSPITAL_BASED_OUTPATIENT_CLINIC_OR_DEPARTMENT_OTHER): Payer: Self-pay | Admitting: Urology

## 2015-02-02 ENCOUNTER — Telehealth: Payer: Self-pay | Admitting: Family Medicine

## 2015-02-02 NOTE — Telephone Encounter (Signed)
Pt came in and requesting a refill on Norco. She states she has a sinus infection. Pt was offered an appt she declined stating she didn't want to come in, didn't think it was necessary. She just wanted pain medication to help with the discomfort. Pt can be reached at [phone]. Pt was informed that JCL not is office today and it would be tomorrow.

## 2015-02-03 NOTE — Telephone Encounter (Signed)
Let her know that I do not call in  [place] for sinus infection. Recommend 2 Aleve twice per day and recommend appointment if she thinks she needs an antibiotic

## 2015-02-03 NOTE — Telephone Encounter (Signed)
Left pt word for word message on # she left to call back on

## 2015-03-01 ENCOUNTER — Ambulatory Visit (INDEPENDENT_AMBULATORY_CARE_PROVIDER_SITE_OTHER): Payer: 59 | Admitting: Family Medicine

## 2015-03-01 ENCOUNTER — Other Ambulatory Visit: Payer: Self-pay

## 2015-03-01 ENCOUNTER — Encounter: Payer: Self-pay | Admitting: Family Medicine

## 2015-03-01 VITALS — BP 124/80 | HR 72 | Ht 60.5 in | Wt 174.4 lb

## 2015-03-01 DIAGNOSIS — Z8639 Personal history of other endocrine, nutritional and metabolic disease: Secondary | ICD-10-CM | POA: Diagnosis not present

## 2015-03-01 DIAGNOSIS — J4530 Mild persistent asthma, uncomplicated: Secondary | ICD-10-CM | POA: Diagnosis not present

## 2015-03-01 DIAGNOSIS — Z1231 Encounter for screening mammogram for malignant neoplasm of breast: Secondary | ICD-10-CM

## 2015-03-01 DIAGNOSIS — Z0000 Encounter for general adult medical examination without abnormal findings: Secondary | ICD-10-CM | POA: Diagnosis not present

## 2015-03-01 LAB — CBC WITH DIFFERENTIAL/PLATELET
Basophils Absolute: 0 10*3/uL (ref 0.0–0.1)
Basophils Relative: 0 % (ref 0–1)
Eosinophils Absolute: 0.1 10*3/uL (ref 0.0–0.7)
Eosinophils Relative: 2 % (ref 0–5)
HCT: 37.6 % (ref 36.0–46.0)
Hemoglobin: 12.5 g/dL (ref 12.0–15.0)
Lymphocytes Relative: 32 % (ref 12–46)
Lymphs Abs: 1.5 10*3/uL (ref 0.7–4.0)
MCH: 29.1 pg (ref 26.0–34.0)
MCHC: 33.2 g/dL (ref 30.0–36.0)
MCV: 87.6 fL (ref 78.0–100.0)
MPV: 9.5 fL (ref 8.6–12.4)
Monocytes Absolute: 0.3 10*3/uL (ref 0.1–1.0)
Monocytes Relative: 7 % (ref 3–12)
Neutro Abs: 2.8 10*3/uL (ref 1.7–7.7)
Neutrophils Relative %: 59 % (ref 43–77)
Platelets: 236 10*3/uL (ref 150–400)
RBC: 4.29 MIL/uL (ref 3.87–5.11)
RDW: 13.8 % (ref 11.5–15.5)
WBC: 4.8 10*3/uL (ref 4.0–10.5)

## 2015-03-01 MED ORDER — ALBUTEROL SULFATE HFA 108 (90 BASE) MCG/ACT IN AERS: 2.0000 | INHALATION_SPRAY | Freq: Four times a day (QID) | RESPIRATORY_TRACT | Status: AC | PRN

## 2015-03-01 MED ORDER — MOMETASONE FURO-FORMOTEROL FUM 200-5 MCG/ACT IN AERO: 2.0000 | INHALATION_SPRAY | Freq: Two times a day (BID) | RESPIRATORY_TRACT | Status: DC

## 2015-03-01 NOTE — Progress Notes (Signed)
   Subjective:    Patient ID: Maureen Love, female    DOB: [birth year], 64 y.o.   MRN: [id number]  HPI She is here for a complete examination. She has weaned herself off of Effexor and is using OTC medications. She states that psychologically she is doing quite well. She does occasionally use Xanax stating once or twice per week usually to help her with anxiety and sleep. She continues on her asthma medication and is having no difficulty with that. She has a previous history of vitamin D deficiency however recent blood work did show high vitamin D level. She continues on multiple OTC medications. She keeps herself quite busy. Social and family history as well as health maintenance and immunizations were reviewed. She will set up for her mammogram. She's had no chest pain, nausea, vomiting or gynecologic problems. She is not interested in having a Pap today.   Review of Systems  All other systems reviewed and are negative.      Objective:   Physical Exam BP 124/80 mmHg  Pulse 72  Ht 5' 0.5" (1.537 m)  Wt 174 lb 6.4 oz (79.107 kg)  BMI 33.49 kg/m2  General Appearance:    Alert, cooperative, no distress, appears stated age  Head:    Normocephalic, without obvious abnormality, atraumatic  Eyes:    PERRL, conjunctiva/corneas clear, EOM's intact, fundi    benign  Ears:    Normal TM's and external ear canals  Nose:   Nares normal, mucosa normal, no drainage or sinus   tenderness  Throat:   Lips, mucosa, and tongue normal; teeth and gums normal  Neck:   Supple, no lymphadenopathy;  thyroid:  no   enlargement/tenderness/nodules; no carotid   bruit or JVD  Back:    Spine nontender, no curvature, ROM normal, no CVA     tenderness  Lungs:     Clear to auscultation bilaterally without wheezes, rales or     ronchi; respirations unlabored  Chest Wall:    No tenderness or deformity   Heart:    Regular rate and rhythm, S1 and S2 normal, no murmur, rub   or gallop  Breast Exam:    Deferred to GYN    Abdomen:     Soft, non-tender, nondistended, normoactive bowel sounds,    no masses, no hepatosplenomegaly  Genitalia:    Deferred to GYN     Extremities:   No clubbing, cyanosis or edema  Pulses:   2+ and symmetric all extremities  Skin:   Skin color, texture, turgor normal, no rashes or lesions  Lymph nodes:   Cervical, supraclavicular, and axillary nodes normal  Neurologic:   CNII-XII intact, normal strength, sensation and gait; reflexes 2+ and symmetric throughout          Psych:   Normal mood, affect, hygiene and grooming.          Assessment & Plan:  Routine general medical examination at a health care facility - Plan: CBC with Differential/Platelet, Lipid panel  History of vitamin D deficiency  Asthma in adult, mild persistent, uncomplicated - Plan: mometasone-formoterol (DULERA) 200-5 MCG/ACT AERO, albuterol (PROVENTIL HFA;VENTOLIN HFA) 108 (90 BASE) MCG/ACT inhaler Recommend she try the albuterol inhaler the next time she has difficulty with coughing and if further trouble I will consider calling in Tussionex. Explained that the cough could easily be asthma related. Continue on present medication regimens.

## 2015-03-02 LAB — LIPID PANEL
Cholesterol: 274 mg/dL — ABNORMAL HIGH (ref 0–200)
HDL: 36 mg/dL — ABNORMAL LOW (ref >=46)
LDL Cholesterol: 190 mg/dL — ABNORMAL HIGH (ref 0–99)
Total CHOL/HDL Ratio: 7.6 Ratio
Triglycerides: 241 mg/dL — ABNORMAL HIGH (ref <150)
VLDL: 48 mg/dL — ABNORMAL HIGH (ref 0–40)

## 2015-03-02 LAB — HM MAMMOGRAPHY: HM Mammogram: NEGATIVE

## 2015-03-03 ENCOUNTER — Encounter: Payer: Self-pay | Admitting: Internal Medicine

## 2015-03-11 ENCOUNTER — Ambulatory Visit: Payer: 59

## 2015-05-13 ENCOUNTER — Other Ambulatory Visit: Payer: Self-pay

## 2015-05-13 ENCOUNTER — Telehealth: Payer: Self-pay | Admitting: Internal Medicine

## 2015-05-13 DIAGNOSIS — Z1211 Encounter for screening for malignant neoplasm of colon: Secondary | ICD-10-CM

## 2015-05-13 NOTE — Telephone Encounter (Signed)
Set this up 

## 2015-05-13 NOTE — Telephone Encounter (Signed)
Called [place] patient is not due till 11/2015 informed her and told her to call ins. Company and inform them pt verbalized understanding

## 2015-05-13 NOTE — Telephone Encounter (Signed)
Pt got a letter stating that she has to have a colonoscopy scheduled by July 31st or they will cancel her insurance. Please refer pt to [facility].

## 2015-05-16 ENCOUNTER — Encounter: Payer: Self-pay | Admitting: Internal Medicine

## 2015-06-03 ENCOUNTER — Telehealth: Payer: Self-pay | Admitting: Internal Medicine

## 2015-06-03 MED ORDER — ALPRAZOLAM 0.25 MG PO TABS: 0.2500 mg | ORAL_TABLET | Freq: Every evening | ORAL | Status: DC | PRN

## 2015-06-03 NOTE — Telephone Encounter (Signed)
Looks like we have never filled this for her. This is for a mail order pharmacy. How many pills do you wanna give

## 2015-06-03 NOTE — Telephone Encounter (Signed)
done

## 2015-06-03 NOTE — Telephone Encounter (Signed)
Faxed form back to [phone]

## 2015-06-03 NOTE — Telephone Encounter (Signed)
Refill request for alprazolam to [facility]

## 2015-06-03 NOTE — Telephone Encounter (Signed)
Please look at this 

## 2015-06-03 NOTE — Telephone Encounter (Signed)
Okay for refill on this but I do not see a number of pills so check with the pharmacy on the number and renew based on that

## 2015-06-03 NOTE — Telephone Encounter (Signed)
Give her 66

## 2015-08-12 ENCOUNTER — Other Ambulatory Visit (INDEPENDENT_AMBULATORY_CARE_PROVIDER_SITE_OTHER): Payer: Commercial Managed Care - HMO

## 2015-08-12 DIAGNOSIS — Z23 Encounter for immunization: Secondary | ICD-10-CM

## 2015-08-24 ENCOUNTER — Encounter: Payer: Self-pay | Admitting: Family Medicine

## 2015-09-15 ENCOUNTER — Encounter: Payer: Self-pay | Admitting: Family Medicine

## 2015-09-15 LAB — HM COLONOSCOPY

## 2015-09-20 ENCOUNTER — Encounter: Payer: Self-pay | Admitting: Family Medicine

## 2015-11-30 ENCOUNTER — Telehealth: Payer: Self-pay | Admitting: Family Medicine

## 2015-11-30 NOTE — Telephone Encounter (Signed)
Called patient left VM refill on xanax sent [facility]

## 2015-11-30 NOTE — Telephone Encounter (Signed)
Pt came in and requested a refill. She needs refill on xanax sent [facility]. pt can be reached at [phone].

## 2015-11-30 NOTE — Telephone Encounter (Signed)
Okay to renew

## 2015-12-08 ENCOUNTER — Telehealth: Payer: Self-pay | Admitting: Family Medicine

## 2015-12-08 NOTE — Telephone Encounter (Signed)
Shauna called rx into [facility] Alp[facility]olam #[facility]

## 2015-12-08 NOTE — Telephone Encounter (Signed)
[facility] req Alprazolam refill

## 2015-12-12 ENCOUNTER — Ambulatory Visit (INDEPENDENT_AMBULATORY_CARE_PROVIDER_SITE_OTHER): Payer: Commercial Managed Care - HMO | Admitting: Family Medicine

## 2015-12-12 ENCOUNTER — Encounter: Payer: Self-pay | Admitting: Family Medicine

## 2015-12-12 VITALS — BP 118/76 | HR 90 | Wt 175.0 lb

## 2015-12-12 DIAGNOSIS — D1720 Benign lipomatous neoplasm of skin and subcutaneous tissue of unspecified limb: Secondary | ICD-10-CM | POA: Diagnosis not present

## 2015-12-12 DIAGNOSIS — E669 Obesity, unspecified: Secondary | ICD-10-CM

## 2015-12-12 DIAGNOSIS — N3281 Overactive bladder: Secondary | ICD-10-CM | POA: Diagnosis not present

## 2015-12-12 MED ORDER — MIRABEGRON ER 25 MG PO TB24: 25.0000 mg | ORAL_TABLET | Freq: Every day | ORAL | Status: DC

## 2015-12-12 NOTE — Progress Notes (Signed)
   Subjective:    Patient ID: Maureen Love, female    DOB: January 11, 1951, 65 y.o.   MRN: [id number]  HPI She is here for consult concerning continued difficulty with OAB. Apparently she has been getting Botox injections however they only last a couple of months and she is looking for an alternative. She has tried other medications however they cause dry mouth. She also would like be to reevaluate the lesion on her right shoulder. She did have a removed and it apparently has grown back. She also has questions about weight loss medication.   Review of Systems     Objective:   Physical Exam Alert and in no distress. Round smooth movable lesion is noted on the right posterior shoulder with a surgical scar overlying it.       Assessment & Plan:  OAB (overactive bladder) - Plan: mirabegron ER (MYRBETRIQ) 25 MG TB24 tablet  Lipoma of arm  Obesity - Plan: Amb ref to Medical Nutrition Therapy-MNT Reassured her that the lipoma does not need to have any further intervention. I will also give her Myrbetriq 25 mg. She is to call me in one week and let me know how she is doing. Also discussed weight loss with her in regard to diet and exercise. She has never been on a regimented program. Her exercise is quite sporadic. She states that she does not like to go outside when it's cold or what to go to a gym. I explained that she is limiting her physical activities because of this. I will refer to nutritionist to help her with that. Discussed the fact that we can potentially give her medication at a later date after she has been educated further about nutrition.

## 2015-12-15 ENCOUNTER — Telehealth: Payer: Self-pay | Admitting: Family Medicine

## 2015-12-15 DIAGNOSIS — N3281 Overactive bladder: Secondary | ICD-10-CM

## 2015-12-15 MED ORDER — MIRABEGRON ER 25 MG PO TB24: 25.0000 mg | ORAL_TABLET | Freq: Every day | ORAL | Status: DC

## 2015-12-15 NOTE — Telephone Encounter (Signed)
Pt said we had given her samples and now she wants meds Myrbetriq 25 mg called into [facility]

## 2016-01-10 ENCOUNTER — Telehealth: Payer: Self-pay | Admitting: Family Medicine

## 2016-01-10 NOTE — Telephone Encounter (Signed)
Pt states Mybetriq is working great but was informed by [facility] that it requires P.A.  States has tried other meds in past but they caused cotton mouth so bad she couldn't take them.

## 2016-01-11 NOTE — Telephone Encounter (Signed)
Pt stopped by and wanted status on Myrbetriq ER 25 mg.  I advised her it was in Prior Authorization.   Please call her when you return with an updated status  [phone]

## 2016-01-12 NOTE — Telephone Encounter (Signed)
Checked medication history and can't locate any other meds that she has tried.  I left a message for pt to try and obtain the names of the medications to complete the [employer] looking for trials of Oxybutynin, Tolterodine, Trospium

## 2016-01-12 NOTE — Telephone Encounter (Signed)
Pt returned call to Mickel Baas to let her know that she has not tried any of the three meds that Mickel Baas mentioned. Pt has tried a med that was given to her by her obgyn several years ago but it gave her cotton mouth really bad so she started trying Botox. Pt can not remember the name of the med that her obgyn gave her but if it is needed she may be able to find the name at home somewhere. Pt is at work so if Mickel Baas calls her back today, just leave a message if the pt does not answer.

## 2016-01-12 NOTE — Telephone Encounter (Signed)
Completed PA

## 2016-01-15 MED ORDER — OXYBUTYNIN CHLORIDE ER 5 MG PO TB24: 5.0000 mg | ORAL_TABLET | Freq: Every day | ORAL | Status: DC

## 2016-01-15 NOTE — Telephone Encounter (Signed)
R node that I called some medicine and let us know in a week whether it's working

## 2016-01-15 NOTE — Telephone Encounter (Signed)
P.A. Isabelle Course, is plan exclusion.  Do you want to switch to either covered alternative,  Oxybutynin, Tolterodine, Trospium ? Pt is ok trying either

## 2016-01-16 NOTE — Telephone Encounter (Signed)
Left detailed message for pt and if she had any other questions to call back

## 2016-02-20 ENCOUNTER — Telehealth: Payer: Self-pay | Admitting: Family Medicine

## 2016-02-20 ENCOUNTER — Other Ambulatory Visit: Payer: Self-pay

## 2016-02-20 MED ORDER — OXYBUTYNIN CHLORIDE ER 5 MG PO TB24: 5.0000 mg | ORAL_TABLET | Freq: Every day | ORAL | Status: DC

## 2016-02-20 NOTE — Telephone Encounter (Signed)
Pt come by and states that she needs her RX OXYBUTYNIN sent to the [place] [place]

## 2016-02-20 NOTE — Telephone Encounter (Signed)
[facility]

## 2016-03-15 ENCOUNTER — Ambulatory Visit (INDEPENDENT_AMBULATORY_CARE_PROVIDER_SITE_OTHER): Payer: Commercial Managed Care - HMO | Admitting: Family Medicine

## 2016-03-15 ENCOUNTER — Encounter: Payer: Self-pay | Admitting: Family Medicine

## 2016-03-15 VITALS — BP 126/80 | HR 66 | Ht 61.0 in | Wt 174.0 lb

## 2016-03-15 DIAGNOSIS — G43009 Migraine without aura, not intractable, without status migrainosus: Secondary | ICD-10-CM

## 2016-03-15 DIAGNOSIS — J4530 Mild persistent asthma, uncomplicated: Secondary | ICD-10-CM

## 2016-03-15 DIAGNOSIS — K219 Gastro-esophageal reflux disease without esophagitis: Secondary | ICD-10-CM

## 2016-03-15 DIAGNOSIS — Z0000 Encounter for general adult medical examination without abnormal findings: Secondary | ICD-10-CM | POA: Diagnosis not present

## 2016-03-15 DIAGNOSIS — E669 Obesity, unspecified: Secondary | ICD-10-CM | POA: Diagnosis not present

## 2016-03-15 DIAGNOSIS — E785 Hyperlipidemia, unspecified: Secondary | ICD-10-CM | POA: Diagnosis not present

## 2016-03-15 DIAGNOSIS — Z1159 Encounter for screening for other viral diseases: Secondary | ICD-10-CM | POA: Diagnosis not present

## 2016-03-15 DIAGNOSIS — N3281 Overactive bladder: Secondary | ICD-10-CM | POA: Diagnosis not present

## 2016-03-15 DIAGNOSIS — Z8639 Personal history of other endocrine, nutritional and metabolic disease: Secondary | ICD-10-CM | POA: Diagnosis not present

## 2016-03-15 DIAGNOSIS — J302 Other seasonal allergic rhinitis: Secondary | ICD-10-CM

## 2016-03-15 LAB — CBC WITH DIFFERENTIAL/PLATELET
Basophils Absolute: 0 cells/uL (ref 0–200)
Basophils Relative: 0 %
Eosinophils Absolute: 100 cells/uL (ref 15–500)
Eosinophils Relative: 2 %
HCT: 39.3 % (ref 35.0–45.0)
Hemoglobin: 13 g/dL (ref 11.7–15.5)
Lymphocytes Relative: 32 %
Lymphs Abs: 1600 cells/uL (ref 850–3900)
MCH: 28.4 pg (ref 27.0–33.0)
MCHC: 33.1 g/dL (ref 32.0–36.0)
MCV: 85.8 fL (ref 80.0–100.0)
MPV: 10 fL (ref 7.5–12.5)
Monocytes Absolute: 300 cells/uL (ref 200–950)
Monocytes Relative: 6 %
Neutro Abs: 3000 cells/uL (ref 1500–7800)
Neutrophils Relative %: 60 %
Platelets: 251 10*3/uL (ref 140–400)
RBC: 4.58 MIL/uL (ref 3.80–5.10)
RDW: 13.3 % (ref 11.0–15.0)
WBC: 5 10*3/uL (ref 4.0–10.5)

## 2016-03-15 LAB — TSH: TSH: 2.35 mIU/L

## 2016-03-15 NOTE — Progress Notes (Signed)
Subjective:    Patient ID: Maureen Love, female    DOB: April 26, 1951, 65 y.o.   MRN: [id number]  HPI  she is here for a complete examination. She does have history of asthma and presently this is well controlled. She also has underlying allergies and is getting shots for this. Her migraines give her very little difficulty having only 2 per year. She does have a history of hyperlipidemia and in the past had been on statins which apparently cause muscle aches. Review of the record also indicates of vitamin D deficiency as well as question of hypothyroid. Presently she is on no thyroid medications. Does have reflux disease and this seems to be under good control. She still complains of inability to lose weight. Health maintenance, immunizations, social and family history were reviewed and are unchanged. She will get a Pap next year. She will also get a mammogram next year.   Review of Systems  All other systems reviewed and are negative.      Objective:   Physical Exam BP 126/80 mmHg  Pulse 66  Ht 5\' 1"  (1.549 m)  Wt 174 lb (78.926 kg)  BMI 32.89 kg/m2  General Appearance:    Alert, cooperative, no distress, appears stated age  Head:    Normocephalic, without obvious abnormality, atraumatic  Eyes:    PERRL, conjunctiva/corneas clear, EOM's intact, fundi    benign  Ears:    Normal TM's and external ear canals  Nose:   Nares normal, mucosa normal, no drainage or sinus   tenderness  Throat:   Lips, mucosa, and tongue normal; teeth and gums normal  Neck:   Supple, no lymphadenopathy;  thyroid:  no   enlargement/tenderness/nodules; no carotid   bruit or JVD  Back:    Spine nontender, no curvature, ROM normal, no CVA     tenderness  Lungs:     Clear to auscultation bilaterally without wheezes, rales or     ronchi; respirations unlabored  Chest Wall:    No tenderness or deformity   Heart:    Regular rate and rhythm, S1 and S2 normal, no murmur, rub   or gallop  Breast Exam:    Deferred to  GYN  Abdomen:     Soft, non-tender, nondistended, normoactive bowel sounds,    no masses, no hepatosplenomegaly  Genitalia:    Deferred to GYN     Extremities:   No clubbing, cyanosis or edema  Pulses:   2+ and symmetric all extremities  Skin:   Skin color, texture, turgor normal, no rashes or lesions  Lymph nodes:   Cervical, supraclavicular, and axillary nodes normal  Neurologic:   CNII-XII intact, normal strength, sensation and gait; reflexes 2+ and symmetric throughout          Psych:   Normal mood, affect, hygiene and grooming.          Assessment & Plan:  Routine general medical examination at a health care facility - Plan: CBC with Differential/Platelet, Comprehensive metabolic panel, Lipid panel  Asthma in adult, mild persistent, uncomplicated  History of vitamin D deficiency - Plan: VITAMIN D 25 Hydroxy (Vit-D Deficiency, Fractures)  OAB (overactive bladder)  Obesity  Gastroesophageal reflux disease without esophagitis  Migraine without aura and without status migrainosus, not intractable  Need for hepatitis C screening test - Plan: Hepatitis C antibody  History of hypothyroidism - Plan: TSH  overall she is doing quite well. I encouraged her to continue with her diet and exercise regimen she  will also continue on all of her present medications.

## 2016-03-16 LAB — COMPREHENSIVE METABOLIC PANEL
ALT: 19 U/L (ref 6–29)
AST: 17 U/L (ref 10–35)
Albumin: 3.8 g/dL (ref 3.6–5.1)
Alkaline Phosphatase: 55 U/L (ref 33–130)
BUN: 18 mg/dL (ref 7–25)
CO2: 24 mmol/L (ref 20–31)
Calcium: 8.6 mg/dL (ref 8.6–10.4)
Chloride: 105 mmol/L (ref 98–110)
Creat: 0.82 mg/dL (ref 0.50–0.99)
Glucose, Bld: 93 mg/dL (ref 65–99)
Potassium: 4.5 mmol/L (ref 3.5–5.3)
Sodium: 139 mmol/L (ref 135–146)
Total Bilirubin: 0.5 mg/dL (ref 0.2–1.2)
Total Protein: 6.2 g/dL (ref 6.1–8.1)

## 2016-03-16 LAB — VITAMIN D 25 HYDROXY (VIT D DEFICIENCY, FRACTURES): Vit D, 25-Hydroxy: 55 ng/mL (ref 30–100)

## 2016-03-16 LAB — LIPID PANEL
Cholesterol: 281 mg/dL — ABNORMAL HIGH (ref 125–200)
HDL: 38 mg/dL — ABNORMAL LOW (ref >=46)
LDL Cholesterol: 194 mg/dL — ABNORMAL HIGH (ref <130)
Total CHOL/HDL Ratio: 7.4 Ratio — ABNORMAL HIGH (ref <=5.0)
Triglycerides: 246 mg/dL — ABNORMAL HIGH (ref <150)
VLDL: 49 mg/dL — ABNORMAL HIGH (ref <30)

## 2016-03-16 LAB — HEPATITIS C ANTIBODY: HCV Ab: NEGATIVE

## 2016-05-02 ENCOUNTER — Telehealth: Payer: Self-pay | Admitting: Family Medicine

## 2016-05-02 MED ORDER — OXYBUTYNIN CHLORIDE ER 10 MG PO TB24: 10.0000 mg | ORAL_TABLET | Freq: Every day | ORAL | Status: DC

## 2016-05-02 NOTE — Telephone Encounter (Signed)
Let her know that I called in a 10 mg dose

## 2016-05-02 NOTE — Telephone Encounter (Signed)
Pt came in and requested a increase in the dose of oxybutynin. She states she thinks it needs to be a little stronger. Please send [facility] days [facility]

## 2016-05-02 NOTE — Telephone Encounter (Signed)
Patient states a 5 mg was not successful. 10 mg will be called in.

## 2016-05-22 ENCOUNTER — Ambulatory Visit (INDEPENDENT_AMBULATORY_CARE_PROVIDER_SITE_OTHER): Payer: Commercial Managed Care - HMO | Admitting: Family Medicine

## 2016-05-22 ENCOUNTER — Encounter: Payer: Self-pay | Admitting: Family Medicine

## 2016-05-22 VITALS — BP 130/90 | HR 90 | Wt 177.0 lb

## 2016-05-22 DIAGNOSIS — N390 Urinary tract infection, site not specified: Secondary | ICD-10-CM | POA: Diagnosis not present

## 2016-05-22 DIAGNOSIS — R82998 Other abnormal findings in urine: Secondary | ICD-10-CM

## 2016-05-22 LAB — POCT URINALYSIS DIPSTICK
Bilirubin, UA: NEGATIVE
Blood, UA: POSITIVE
Glucose, UA: NEGATIVE
Ketones, UA: NEGATIVE
Nitrite, UA: NEGATIVE
Protein, UA: NEGATIVE
Spec Grav, UA: >=1.03
Urobilinogen, UA: NEGATIVE
pH, UA: 5.5

## 2016-05-22 MED ORDER — SULFAMETHOXAZOLE-TRIMETHOPRIM 800-160 MG PO TABS: 1.0000 | ORAL_TABLET | Freq: Two times a day (BID) | ORAL | Status: DC

## 2016-05-22 NOTE — Progress Notes (Signed)
   Subjective:    Patient ID: Maureen Love, female    DOB: 1951/06/16, 65 y.o.   MRN: [id number]  HPI She is here for consult concerning a recent evaluation she had prior to going on to a research protocol. The report did show her urine positive for nitrites and white blood cells. He does have a history of OAB and recently was given more medications for that. No fever, chills, abdominal pain.   Review of Systems     Objective:   Physical Exam Alert and in no distress otherwise not examined. Urine microscopic did show red blood cells present however it was also contaminated.       Assessment & Plan:  Urine white blood cells increased - Plan: POCT Urinalysis Dipstick  UTI (lower urinary tract infection) - Plan: sulfamethoxazole-trimethoprim (BACTRIM DS,SEPTRA DS) 800-160 MG tablet  She will be placed on an antibiotic and have a repeat urinalysis in 2 weeks since she had blood in her urine. She was comfortable with this.

## 2016-06-13 ENCOUNTER — Other Ambulatory Visit (INDEPENDENT_AMBULATORY_CARE_PROVIDER_SITE_OTHER): Payer: Commercial Managed Care - HMO

## 2016-06-13 DIAGNOSIS — Z87448 Personal history of other diseases of urinary system: Secondary | ICD-10-CM | POA: Diagnosis not present

## 2016-06-13 LAB — POCT URINALYSIS DIPSTICK
Bilirubin, UA: NEGATIVE
Blood, UA: NEGATIVE
Glucose, UA: NEGATIVE
Ketones, UA: NEGATIVE
Leukocytes, UA: NEGATIVE
Nitrite, UA: NEGATIVE
Protein, UA: NEGATIVE
Spec Grav, UA: >=1.03
Urobilinogen, UA: NEGATIVE
pH, UA: 6

## 2016-09-06 DIAGNOSIS — M9902 Segmental and somatic dysfunction of thoracic region: Secondary | ICD-10-CM | POA: Diagnosis not present

## 2016-09-06 DIAGNOSIS — M9901 Segmental and somatic dysfunction of cervical region: Secondary | ICD-10-CM | POA: Diagnosis not present

## 2016-09-06 DIAGNOSIS — M9903 Segmental and somatic dysfunction of lumbar region: Secondary | ICD-10-CM | POA: Diagnosis not present

## 2016-09-06 DIAGNOSIS — M545 Low back pain: Secondary | ICD-10-CM | POA: Diagnosis not present

## 2016-09-10 DIAGNOSIS — M545 Low back pain: Secondary | ICD-10-CM | POA: Diagnosis not present

## 2016-09-10 DIAGNOSIS — M9903 Segmental and somatic dysfunction of lumbar region: Secondary | ICD-10-CM | POA: Diagnosis not present

## 2016-09-10 DIAGNOSIS — J3081 Allergic rhinitis due to animal (cat) (dog) hair and dander: Secondary | ICD-10-CM | POA: Diagnosis not present

## 2016-09-10 DIAGNOSIS — M9902 Segmental and somatic dysfunction of thoracic region: Secondary | ICD-10-CM | POA: Diagnosis not present

## 2016-09-10 DIAGNOSIS — J3089 Other allergic rhinitis: Secondary | ICD-10-CM | POA: Diagnosis not present

## 2016-09-10 DIAGNOSIS — J301 Allergic rhinitis due to pollen: Secondary | ICD-10-CM | POA: Diagnosis not present

## 2016-09-10 DIAGNOSIS — M9901 Segmental and somatic dysfunction of cervical region: Secondary | ICD-10-CM | POA: Diagnosis not present

## 2016-09-14 DIAGNOSIS — M9901 Segmental and somatic dysfunction of cervical region: Secondary | ICD-10-CM | POA: Diagnosis not present

## 2016-09-14 DIAGNOSIS — M9902 Segmental and somatic dysfunction of thoracic region: Secondary | ICD-10-CM | POA: Diagnosis not present

## 2016-09-14 DIAGNOSIS — M9903 Segmental and somatic dysfunction of lumbar region: Secondary | ICD-10-CM | POA: Diagnosis not present

## 2016-09-14 DIAGNOSIS — M545 Low back pain: Secondary | ICD-10-CM | POA: Diagnosis not present

## 2016-09-17 DIAGNOSIS — M9902 Segmental and somatic dysfunction of thoracic region: Secondary | ICD-10-CM | POA: Diagnosis not present

## 2016-09-17 DIAGNOSIS — M9903 Segmental and somatic dysfunction of lumbar region: Secondary | ICD-10-CM | POA: Diagnosis not present

## 2016-09-17 DIAGNOSIS — M545 Low back pain: Secondary | ICD-10-CM | POA: Diagnosis not present

## 2016-09-17 DIAGNOSIS — M9901 Segmental and somatic dysfunction of cervical region: Secondary | ICD-10-CM | POA: Diagnosis not present

## 2016-09-20 DIAGNOSIS — M9902 Segmental and somatic dysfunction of thoracic region: Secondary | ICD-10-CM | POA: Diagnosis not present

## 2016-09-20 DIAGNOSIS — M9901 Segmental and somatic dysfunction of cervical region: Secondary | ICD-10-CM | POA: Diagnosis not present

## 2016-09-20 DIAGNOSIS — M545 Low back pain: Secondary | ICD-10-CM | POA: Diagnosis not present

## 2016-09-20 DIAGNOSIS — M9903 Segmental and somatic dysfunction of lumbar region: Secondary | ICD-10-CM | POA: Diagnosis not present

## 2016-09-24 DIAGNOSIS — M9903 Segmental and somatic dysfunction of lumbar region: Secondary | ICD-10-CM | POA: Diagnosis not present

## 2016-09-24 DIAGNOSIS — M9901 Segmental and somatic dysfunction of cervical region: Secondary | ICD-10-CM | POA: Diagnosis not present

## 2016-09-24 DIAGNOSIS — M9902 Segmental and somatic dysfunction of thoracic region: Secondary | ICD-10-CM | POA: Diagnosis not present

## 2016-09-24 DIAGNOSIS — J3081 Allergic rhinitis due to animal (cat) (dog) hair and dander: Secondary | ICD-10-CM | POA: Diagnosis not present

## 2016-09-24 DIAGNOSIS — M545 Low back pain: Secondary | ICD-10-CM | POA: Diagnosis not present

## 2016-09-24 DIAGNOSIS — J3089 Other allergic rhinitis: Secondary | ICD-10-CM | POA: Diagnosis not present

## 2016-09-24 DIAGNOSIS — J301 Allergic rhinitis due to pollen: Secondary | ICD-10-CM | POA: Diagnosis not present

## 2016-09-26 DIAGNOSIS — M9901 Segmental and somatic dysfunction of cervical region: Secondary | ICD-10-CM | POA: Diagnosis not present

## 2016-09-26 DIAGNOSIS — M9902 Segmental and somatic dysfunction of thoracic region: Secondary | ICD-10-CM | POA: Diagnosis not present

## 2016-09-26 DIAGNOSIS — M545 Low back pain: Secondary | ICD-10-CM | POA: Diagnosis not present

## 2016-09-26 DIAGNOSIS — M9903 Segmental and somatic dysfunction of lumbar region: Secondary | ICD-10-CM | POA: Diagnosis not present

## 2016-10-01 DIAGNOSIS — M545 Low back pain: Secondary | ICD-10-CM | POA: Diagnosis not present

## 2016-10-01 DIAGNOSIS — M9901 Segmental and somatic dysfunction of cervical region: Secondary | ICD-10-CM | POA: Diagnosis not present

## 2016-10-01 DIAGNOSIS — M9902 Segmental and somatic dysfunction of thoracic region: Secondary | ICD-10-CM | POA: Diagnosis not present

## 2016-10-01 DIAGNOSIS — M9903 Segmental and somatic dysfunction of lumbar region: Secondary | ICD-10-CM | POA: Diagnosis not present

## 2016-10-08 DIAGNOSIS — J3089 Other allergic rhinitis: Secondary | ICD-10-CM | POA: Diagnosis not present

## 2016-10-08 DIAGNOSIS — J3081 Allergic rhinitis due to animal (cat) (dog) hair and dander: Secondary | ICD-10-CM | POA: Diagnosis not present

## 2016-10-08 DIAGNOSIS — J301 Allergic rhinitis due to pollen: Secondary | ICD-10-CM | POA: Diagnosis not present

## 2016-10-19 DIAGNOSIS — J301 Allergic rhinitis due to pollen: Secondary | ICD-10-CM | POA: Diagnosis not present

## 2016-10-19 DIAGNOSIS — J3081 Allergic rhinitis due to animal (cat) (dog) hair and dander: Secondary | ICD-10-CM | POA: Diagnosis not present

## 2016-10-19 DIAGNOSIS — J3089 Other allergic rhinitis: Secondary | ICD-10-CM | POA: Diagnosis not present

## 2016-10-31 DIAGNOSIS — J3089 Other allergic rhinitis: Secondary | ICD-10-CM | POA: Diagnosis not present

## 2016-10-31 DIAGNOSIS — J3081 Allergic rhinitis due to animal (cat) (dog) hair and dander: Secondary | ICD-10-CM | POA: Diagnosis not present

## 2016-10-31 DIAGNOSIS — J301 Allergic rhinitis due to pollen: Secondary | ICD-10-CM | POA: Diagnosis not present

## 2016-11-13 DIAGNOSIS — J3081 Allergic rhinitis due to animal (cat) (dog) hair and dander: Secondary | ICD-10-CM | POA: Diagnosis not present

## 2016-11-13 DIAGNOSIS — J4540 Moderate persistent asthma, uncomplicated: Secondary | ICD-10-CM | POA: Diagnosis not present

## 2016-11-13 DIAGNOSIS — J3089 Other allergic rhinitis: Secondary | ICD-10-CM | POA: Diagnosis not present

## 2016-11-13 DIAGNOSIS — J301 Allergic rhinitis due to pollen: Secondary | ICD-10-CM | POA: Diagnosis not present

## 2016-11-19 DIAGNOSIS — M9903 Segmental and somatic dysfunction of lumbar region: Secondary | ICD-10-CM | POA: Diagnosis not present

## 2016-11-19 DIAGNOSIS — M545 Low back pain: Secondary | ICD-10-CM | POA: Diagnosis not present

## 2016-11-19 DIAGNOSIS — M9901 Segmental and somatic dysfunction of cervical region: Secondary | ICD-10-CM | POA: Diagnosis not present

## 2016-11-19 DIAGNOSIS — M9902 Segmental and somatic dysfunction of thoracic region: Secondary | ICD-10-CM | POA: Diagnosis not present

## 2016-12-11 DIAGNOSIS — J301 Allergic rhinitis due to pollen: Secondary | ICD-10-CM | POA: Diagnosis not present

## 2016-12-11 DIAGNOSIS — J3081 Allergic rhinitis due to animal (cat) (dog) hair and dander: Secondary | ICD-10-CM | POA: Diagnosis not present

## 2016-12-11 DIAGNOSIS — J3089 Other allergic rhinitis: Secondary | ICD-10-CM | POA: Diagnosis not present

## 2017-01-08 DIAGNOSIS — J3089 Other allergic rhinitis: Secondary | ICD-10-CM | POA: Diagnosis not present

## 2017-01-08 DIAGNOSIS — J301 Allergic rhinitis due to pollen: Secondary | ICD-10-CM | POA: Diagnosis not present

## 2017-01-08 DIAGNOSIS — J3081 Allergic rhinitis due to animal (cat) (dog) hair and dander: Secondary | ICD-10-CM | POA: Diagnosis not present

## 2017-01-23 DIAGNOSIS — H5203 Hypermetropia, bilateral: Secondary | ICD-10-CM | POA: Diagnosis not present

## 2017-01-23 DIAGNOSIS — H25813 Combined forms of age-related cataract, bilateral: Secondary | ICD-10-CM | POA: Diagnosis not present

## 2017-01-23 DIAGNOSIS — H1045 Other chronic allergic conjunctivitis: Secondary | ICD-10-CM | POA: Diagnosis not present

## 2017-02-05 DIAGNOSIS — J301 Allergic rhinitis due to pollen: Secondary | ICD-10-CM | POA: Diagnosis not present

## 2017-02-05 DIAGNOSIS — J3089 Other allergic rhinitis: Secondary | ICD-10-CM | POA: Diagnosis not present

## 2017-02-05 DIAGNOSIS — J3081 Allergic rhinitis due to animal (cat) (dog) hair and dander: Secondary | ICD-10-CM | POA: Diagnosis not present

## 2017-03-04 DIAGNOSIS — J3089 Other allergic rhinitis: Secondary | ICD-10-CM | POA: Diagnosis not present

## 2017-03-04 DIAGNOSIS — J301 Allergic rhinitis due to pollen: Secondary | ICD-10-CM | POA: Diagnosis not present

## 2017-03-04 DIAGNOSIS — J3081 Allergic rhinitis due to animal (cat) (dog) hair and dander: Secondary | ICD-10-CM | POA: Diagnosis not present

## 2017-03-13 DIAGNOSIS — J3081 Allergic rhinitis due to animal (cat) (dog) hair and dander: Secondary | ICD-10-CM | POA: Diagnosis not present

## 2017-03-13 DIAGNOSIS — J3089 Other allergic rhinitis: Secondary | ICD-10-CM | POA: Diagnosis not present

## 2017-03-13 DIAGNOSIS — J301 Allergic rhinitis due to pollen: Secondary | ICD-10-CM | POA: Diagnosis not present

## 2017-03-15 DIAGNOSIS — J301 Allergic rhinitis due to pollen: Secondary | ICD-10-CM | POA: Diagnosis not present

## 2017-03-15 DIAGNOSIS — J3089 Other allergic rhinitis: Secondary | ICD-10-CM | POA: Diagnosis not present

## 2017-03-15 DIAGNOSIS — J3081 Allergic rhinitis due to animal (cat) (dog) hair and dander: Secondary | ICD-10-CM | POA: Diagnosis not present

## 2017-03-18 DIAGNOSIS — J3089 Other allergic rhinitis: Secondary | ICD-10-CM | POA: Diagnosis not present

## 2017-03-18 DIAGNOSIS — J301 Allergic rhinitis due to pollen: Secondary | ICD-10-CM | POA: Diagnosis not present

## 2017-03-18 DIAGNOSIS — J3081 Allergic rhinitis due to animal (cat) (dog) hair and dander: Secondary | ICD-10-CM | POA: Diagnosis not present

## 2017-03-20 DIAGNOSIS — J3089 Other allergic rhinitis: Secondary | ICD-10-CM | POA: Diagnosis not present

## 2017-03-20 DIAGNOSIS — J301 Allergic rhinitis due to pollen: Secondary | ICD-10-CM | POA: Diagnosis not present

## 2017-03-20 DIAGNOSIS — J3081 Allergic rhinitis due to animal (cat) (dog) hair and dander: Secondary | ICD-10-CM | POA: Diagnosis not present

## 2017-03-21 ENCOUNTER — Ambulatory Visit: Payer: Commercial Managed Care - HMO | Admitting: Family Medicine

## 2017-03-26 DIAGNOSIS — J3089 Other allergic rhinitis: Secondary | ICD-10-CM | POA: Diagnosis not present

## 2017-03-26 DIAGNOSIS — J3081 Allergic rhinitis due to animal (cat) (dog) hair and dander: Secondary | ICD-10-CM | POA: Diagnosis not present

## 2017-03-26 DIAGNOSIS — J301 Allergic rhinitis due to pollen: Secondary | ICD-10-CM | POA: Diagnosis not present

## 2017-04-15 DIAGNOSIS — J301 Allergic rhinitis due to pollen: Secondary | ICD-10-CM | POA: Diagnosis not present

## 2017-04-15 DIAGNOSIS — J3089 Other allergic rhinitis: Secondary | ICD-10-CM | POA: Diagnosis not present

## 2017-04-15 DIAGNOSIS — J3081 Allergic rhinitis due to animal (cat) (dog) hair and dander: Secondary | ICD-10-CM | POA: Diagnosis not present

## 2017-04-17 ENCOUNTER — Ambulatory Visit (INDEPENDENT_AMBULATORY_CARE_PROVIDER_SITE_OTHER): Payer: PPO | Admitting: Family Medicine

## 2017-04-17 ENCOUNTER — Encounter: Payer: Self-pay | Admitting: Family Medicine

## 2017-04-17 ENCOUNTER — Other Ambulatory Visit: Payer: Self-pay | Admitting: Family Medicine

## 2017-04-17 VITALS — BP 122/70 | HR 85 | Ht 60.0 in | Wt 173.8 lb

## 2017-04-17 DIAGNOSIS — E669 Obesity, unspecified: Secondary | ICD-10-CM

## 2017-04-17 DIAGNOSIS — Z8639 Personal history of other endocrine, nutritional and metabolic disease: Secondary | ICD-10-CM | POA: Diagnosis not present

## 2017-04-17 DIAGNOSIS — E2839 Other primary ovarian failure: Secondary | ICD-10-CM

## 2017-04-17 DIAGNOSIS — J302 Other seasonal allergic rhinitis: Secondary | ICD-10-CM

## 2017-04-17 DIAGNOSIS — Z13820 Encounter for screening for osteoporosis: Secondary | ICD-10-CM | POA: Diagnosis not present

## 2017-04-17 DIAGNOSIS — Z1239 Encounter for other screening for malignant neoplasm of breast: Secondary | ICD-10-CM

## 2017-04-17 DIAGNOSIS — Z0000 Encounter for general adult medical examination without abnormal findings: Secondary | ICD-10-CM | POA: Diagnosis not present

## 2017-04-17 DIAGNOSIS — J4530 Mild persistent asthma, uncomplicated: Secondary | ICD-10-CM

## 2017-04-17 DIAGNOSIS — Z23 Encounter for immunization: Secondary | ICD-10-CM | POA: Diagnosis not present

## 2017-04-17 DIAGNOSIS — J4520 Mild intermittent asthma, uncomplicated: Secondary | ICD-10-CM | POA: Diagnosis not present

## 2017-04-17 DIAGNOSIS — K219 Gastro-esophageal reflux disease without esophagitis: Secondary | ICD-10-CM

## 2017-04-17 DIAGNOSIS — Z1231 Encounter for screening mammogram for malignant neoplasm of breast: Secondary | ICD-10-CM | POA: Diagnosis not present

## 2017-04-17 DIAGNOSIS — N3281 Overactive bladder: Secondary | ICD-10-CM | POA: Diagnosis not present

## 2017-04-17 DIAGNOSIS — G43009 Migraine without aura, not intractable, without status migrainosus: Secondary | ICD-10-CM

## 2017-04-17 DIAGNOSIS — E785 Hyperlipidemia, unspecified: Secondary | ICD-10-CM | POA: Diagnosis not present

## 2017-04-17 LAB — CBC WITH DIFFERENTIAL/PLATELET
Basophils Absolute: 0 cells/uL (ref 0–200)
Basophils Relative: 0 %
Eosinophils Absolute: 110 cells/uL (ref 15–500)
Eosinophils Relative: 2 %
HCT: 37.3 % (ref 35.0–45.0)
Hemoglobin: 12.4 g/dL (ref 11.7–15.5)
Lymphocytes Relative: 33 %
Lymphs Abs: 1815 cells/uL (ref 850–3900)
MCH: 29.1 pg (ref 27.0–33.0)
MCHC: 33.2 g/dL (ref 32.0–36.0)
MCV: 87.6 fL (ref 80.0–100.0)
MPV: 9.7 fL (ref 7.5–12.5)
Monocytes Absolute: 440 cells/uL (ref 200–950)
Monocytes Relative: 8 %
Neutro Abs: 3135 cells/uL (ref 1500–7800)
Neutrophils Relative %: 57 %
Platelets: 259 10*3/uL (ref 140–400)
RBC: 4.26 MIL/uL (ref 3.80–5.10)
RDW: 14 % (ref 11.0–15.0)
WBC: 5.5 10*3/uL (ref 4.0–10.5)

## 2017-04-17 LAB — POCT URINALYSIS DIP (PROADVANTAGE DEVICE)
Bilirubin, UA: NEGATIVE
Glucose, UA: NEGATIVE mg/dL
Ketones, POC UA: NEGATIVE mg/dL
Nitrite, UA: NEGATIVE
Specific Gravity, Urine: NEGATIVE
Urobilinogen, Ur: NEGATIVE
pH, UA: 5.5 (ref 5.0–8.0)

## 2017-04-17 LAB — COMPREHENSIVE METABOLIC PANEL
ALT: 18 U/L (ref 6–29)
AST: 14 U/L (ref 10–35)
Albumin: 3.8 g/dL (ref 3.6–5.1)
Alkaline Phosphatase: 48 U/L (ref 33–130)
BUN: 18 mg/dL (ref 7–25)
CO2: 25 mmol/L (ref 20–31)
Calcium: 8.4 mg/dL — ABNORMAL LOW (ref 8.6–10.4)
Chloride: 107 mmol/L (ref 98–110)
Creat: 0.88 mg/dL (ref 0.50–0.99)
Glucose, Bld: 114 mg/dL — ABNORMAL HIGH (ref 65–99)
Potassium: 3.9 mmol/L (ref 3.5–5.3)
Sodium: 140 mmol/L (ref 135–146)
Total Bilirubin: 0.4 mg/dL (ref 0.2–1.2)
Total Protein: 6.2 g/dL (ref 6.1–8.1)

## 2017-04-17 LAB — LIPID PANEL
Cholesterol: 266 mg/dL — ABNORMAL HIGH (ref <200)
HDL: 26 mg/dL — ABNORMAL LOW (ref >50)
Total CHOL/HDL Ratio: 10.2 Ratio — ABNORMAL HIGH (ref <5.0)
Triglycerides: 521 mg/dL — ABNORMAL HIGH (ref <150)

## 2017-04-17 NOTE — Progress Notes (Signed)
Maureen Love is a 66 y.o. female who presents for annual wellness visit,CPE and follow-up on chronic medical conditions.  She follows up with her allergist regularly for treatment of her allergies as well as asthma. She is on Spiriva but says Symbicort does work much better. She also has history of vitamin D deficiency and is on OTC meds for that. She also has OAB and was to continue on that medicine that she says it works well. She admits to not exercising regularly but does note that her weight is stable. She is not having difficulty with reflux and presently is not on medication. Her migraines are under good control and only has difficulty if she over imbibes in alcohol. She is not interested in having a Pap. She has had a colonoscopy. Her immunizations were reviewed and will be updated. She has not had a DEXA scan. She does have a history of hyperlipidemia but is on no medications. She also stopped taking Xanax and now she has difficulty with sleep, she does use melatonin. She and her husband are now retired and recently one on a trip to [place] and [place].  Immunizations and Health Maintenance Immunization History  Administered Date(s) Administered  . Influenza,inj,Quad PF,36+ Mos 08/12/2015  . Influenza-Unspecified 07/15/2014, 09/16/2016  . Pneumococcal Conjugate-13 07/21/2014  . Tdap 12/19/2009  . Zoster 05/20/2014   Health Maintenance Due  Topic Date Due  . HIV Screening  10/05/1966  . PAP SMEAR  11/13/2015  . DEXA SCAN  10/05/2016  . PNA vac Low Risk Adult (2 of 2 - PPSV23) 10/05/2016  . MAMMOGRAM  03/01/2017    Last Pap smear:3 years ago Last mammogram:2016 Last colonoscopy: Last DEXA:2016 Dentist: Dr. Kenton Kingfisher Ophtho: Marica Otter Exercise: Regular exercise  Other doctors caring for patient include: [place]  Advanced directives: No but information given Does Patient Have a Medical Advance Directive?: No Would patient like information on creating a medical advance  directive?: Yes (MAU/Ambulatory/Procedural Areas - Information given)  Depression screen:  See questionnaire below.  Depression screen [facility] 2/9 04/17/2017 03/15/2016  Decreased Interest 0 0  Down, Depressed, Hopeless 0 0  PHQ - 2 Score 0 0    Fall Risk Screen: see questionnaire below. Fall Risk  04/17/2017 03/15/2016  Falls in the past year? No No    ADL screen:  See questionnaire below Functional Status Survey: Is the patient deaf or have difficulty hearing?: Yes (just started) Does the patient have difficulty seeing, even when wearing glasses/contacts?: No Does the patient have difficulty concentrating, remembering, or making decisions?: Yes (remembering issues) Does the patient have difficulty walking or climbing stairs?: (S) Yes (due to asthma) Does the patient have difficulty dressing or bathing?: No Does the patient have difficulty doing errands alone such as visiting a doctor's office or shopping?: No   Review of Systems Negative except as above   PHYSICAL EXAM:  BP 122/70   Pulse 85   Ht 5' (1.524 m)   Wt 173 lb 12.8 oz (78.8 kg)   BMI 33.94 kg/m   General Appearance: Alert, cooperative, no distress, appears stated age Head: Normocephalic, without obvious abnormality, atraumatic Eyes: PERRL, conjunctiva/corneas clear, EOM's intact, fundi benign Ears: Normal TM's and external ear canals Nose: Nares normal, mucosa normal, no drainage or sinus tenderness Throat: Lips, mucosa, and tongue normal; teeth and gums normal Neck: Supple, no lymphadenopathy;  thyroid:  no enlargement/tenderness/nodules; no carotid bruit or JVD Lungs: Clear to auscultation bilaterally without wheezes, rales or ronchi; respirations unlabored Heart: Regular  rate and rhythm, S1 and S2 normal, no murmur, rubor gallop Abdomen: Soft, non-tender, nondistended, normoactive bowel sounds,  no masses, no hepatosplenomegaly Extremities: No clubbing, cyanosis or edema Pulses: 2+ and symmetric all  extremities Skin:  Skin color, texture, turgor normal, no rashes or lesions Lymph nodes: Cervical, supraclavicular, and axillary nodes normal Neurologic:  CNII-XII intact, normal strength, sensation and gait; reflexes 2+ and symmetric throughout Psych: Normal mood, affect, hygiene and grooming. Urinalysis will have to be redone as it was a very small specimen. ASSESSMENT/PLA Routine general medical examination at a health care facility  Asthma in adult, mild persistent, uncomplicated - Plan: CBC with Differential/Platelet, Comprehensive metabolic panel  History of vitamin D deficiency - Plan: VITAMIN D 25 Hydroxy (Vit-D Deficiency, Fractures)  OAB (overactive bladder)  Class 1 obesity without serious comorbidity in adult, unspecified BMI, unspecified obesity type - Plan: CBC with Differential/Platelet, Comprehensive metabolic panel, Lipid panel  Mild intermittent asthma in adult without complication  Gastroesophageal reflux disease without esophagitis  Hyperlipidemia LDL goal <130 - Plan: Lipid panel  Migraine without aura and without status migrainosus, not intractable  Other seasonal allergic rhinitis  Need for 23-polyvalent pneumococcal polysaccharide vaccine - Plan: Pneumococcal polysaccharide vaccine 23-valent greater than or equal to 2yo subcutaneous/IM  Screening for osteoporosis - Plan: DG Bone Density  Screening for breast cancer - Plan: MM DIGITAL SCREENING BILATERAL She will follow-up with allergy and asthma concerning her symptoms there. I will follow-up on the DEXA as needed. Immunizations were updated. She is also to go to the pharmacy to get the Shingrix vaccine.  Discussed mammograms; at least 30 minutes of aerobic activity at least 5 days/week and weight-bearing exercise 2x/week; ; healthy diet, including goals of calcium and vitamin D intake and alcohol recommendations (less than or equal to 1 drink/day) reviewed; regular seatbelt use; Marland Kitchen  Immunization  recommendations discussed.  Colonoscopy recommendations reviewed she will also call back to make sure she is given the correct OAB med. Medicare Attestation I have personally reviewed: The patient's medical and social history Their use of alcohol, tobacco or illicit drugs Their current medications and supplements The patient's functional ability including ADLs,fall risks, home safety risks, cognitive, and hearing and visual impairment Diet and physical activities Evidence for depression or mood disorders  The patient's weight, height, and BMI have been recorded in the chart.  I have made referrals, counseling, and provided education to the patient based on review of the above and I have provided the patient with a written personalized care plan for preventive services.     Wyatt Haste, MD   04/17/2017

## 2017-04-18 ENCOUNTER — Telehealth: Payer: Self-pay

## 2017-04-18 ENCOUNTER — Other Ambulatory Visit: Payer: Self-pay

## 2017-04-18 LAB — VITAMIN D 25 HYDROXY (VIT D DEFICIENCY, FRACTURES): Vit D, 25-Hydroxy: 43 ng/mL (ref 30–100)

## 2017-04-18 NOTE — Telephone Encounter (Signed)
Go ahead and fix this

## 2017-04-18 NOTE — Telephone Encounter (Signed)
[facility] and [facility] need dx changed. Since this is her first DEXA they will not cover osteoporosis. Medicare will usually cover dx of estrogen deficiency. Please update. Victorino December

## 2017-04-18 NOTE — Telephone Encounter (Signed)
I have fixed order

## 2017-04-18 NOTE — Addendum Note (Signed)
Addended by: Randel Books on: 04/18/2017 12:45 PM   Modules accepted: Orders

## 2017-04-19 ENCOUNTER — Telehealth: Payer: Self-pay | Admitting: Family Medicine

## 2017-04-19 LAB — HEMOGLOBIN A1C
Hgb A1c MFr Bld: 5.3 % (ref <5.7)
Mean Plasma Glucose: 105 mg/dL

## 2017-04-19 MED ORDER — OXYBUTYNIN CHLORIDE ER 10 MG PO TB24: 10.0000 mg | ORAL_TABLET | Freq: Every day | ORAL | 3 refills | Status: DC

## 2017-04-19 NOTE — Addendum Note (Signed)
Addended by: Denita Lung on: 04/19/2017 03:45 PM   Modules accepted: Orders

## 2017-04-19 NOTE — Telephone Encounter (Signed)
Pt come by and the RX that needed to be added to her list  is oxybutynin cl er 10 mg.  Take 1 tablet by mouth at bedtime per [facility]

## 2017-04-25 ENCOUNTER — Ambulatory Visit (INDEPENDENT_AMBULATORY_CARE_PROVIDER_SITE_OTHER): Payer: PPO | Admitting: Family Medicine

## 2017-04-25 DIAGNOSIS — E781 Pure hyperglyceridemia: Secondary | ICD-10-CM | POA: Diagnosis not present

## 2017-04-25 NOTE — Progress Notes (Signed)
   Subjective:    Patient ID: Maureen Love, female    DOB: 06-16-1951, 66 y.o.   MRN: [id number]  HPI He is here for consult concerning recent blood work which did show an elevated triglyceride above 500. Prior to that she was on the vacation and did eat and drink anything she wanted. In the last 2 weeks she has made diet and exercise changes specifically in regard to cutting back on carbohydrates. She drinks very little.  Review of Systems     Objective:   Physical Exam Alert and in no distress otherwise not examined       Assessment & Plan:  Hypertriglyceridemia - Plan: Lipid panel Discussed triglycerides with her in regard to risk for pancreatitis and in terms of abnormal metabolism. Her recent A1c was 5.3. She plans to continue to make diet and exercise changes. We will recheck the triglycerides in about 2 months.

## 2017-05-02 DIAGNOSIS — J3081 Allergic rhinitis due to animal (cat) (dog) hair and dander: Secondary | ICD-10-CM | POA: Diagnosis not present

## 2017-05-02 DIAGNOSIS — J301 Allergic rhinitis due to pollen: Secondary | ICD-10-CM | POA: Diagnosis not present

## 2017-05-06 ENCOUNTER — Ambulatory Visit
Admission: RE | Admit: 2017-05-06 | Discharge: 2017-05-06 | Disposition: A | Discharge status: Home / Self Care | Payer: PPO | Source: Ambulatory Visit | Attending: Family Medicine | Admitting: Family Medicine

## 2017-05-06 ENCOUNTER — Other Ambulatory Visit: Payer: Self-pay | Admitting: Family Medicine

## 2017-05-06 DIAGNOSIS — M8589 Other specified disorders of bone density and structure, multiple sites: Secondary | ICD-10-CM | POA: Diagnosis not present

## 2017-05-06 DIAGNOSIS — Z780 Asymptomatic menopausal state: Secondary | ICD-10-CM | POA: Diagnosis not present

## 2017-05-06 DIAGNOSIS — Z1231 Encounter for screening mammogram for malignant neoplasm of breast: Secondary | ICD-10-CM | POA: Diagnosis not present

## 2017-05-06 DIAGNOSIS — E2839 Other primary ovarian failure: Secondary | ICD-10-CM

## 2017-05-14 DIAGNOSIS — J3081 Allergic rhinitis due to animal (cat) (dog) hair and dander: Secondary | ICD-10-CM | POA: Diagnosis not present

## 2017-05-14 DIAGNOSIS — J301 Allergic rhinitis due to pollen: Secondary | ICD-10-CM | POA: Diagnosis not present

## 2017-05-14 DIAGNOSIS — J4540 Moderate persistent asthma, uncomplicated: Secondary | ICD-10-CM | POA: Diagnosis not present

## 2017-05-14 DIAGNOSIS — J3089 Other allergic rhinitis: Secondary | ICD-10-CM | POA: Diagnosis not present

## 2017-05-21 DIAGNOSIS — J301 Allergic rhinitis due to pollen: Secondary | ICD-10-CM | POA: Diagnosis not present

## 2017-05-21 DIAGNOSIS — J3089 Other allergic rhinitis: Secondary | ICD-10-CM | POA: Diagnosis not present

## 2017-05-21 DIAGNOSIS — J3081 Allergic rhinitis due to animal (cat) (dog) hair and dander: Secondary | ICD-10-CM | POA: Diagnosis not present

## 2017-06-19 DIAGNOSIS — J301 Allergic rhinitis due to pollen: Secondary | ICD-10-CM | POA: Diagnosis not present

## 2017-06-19 DIAGNOSIS — J3081 Allergic rhinitis due to animal (cat) (dog) hair and dander: Secondary | ICD-10-CM | POA: Diagnosis not present

## 2017-06-19 DIAGNOSIS — J3089 Other allergic rhinitis: Secondary | ICD-10-CM | POA: Diagnosis not present

## 2017-06-27 ENCOUNTER — Other Ambulatory Visit: Payer: PPO

## 2017-07-02 DIAGNOSIS — J3081 Allergic rhinitis due to animal (cat) (dog) hair and dander: Secondary | ICD-10-CM | POA: Diagnosis not present

## 2017-07-02 DIAGNOSIS — J301 Allergic rhinitis due to pollen: Secondary | ICD-10-CM | POA: Diagnosis not present

## 2017-07-02 DIAGNOSIS — J3089 Other allergic rhinitis: Secondary | ICD-10-CM | POA: Diagnosis not present

## 2017-07-18 DIAGNOSIS — J3081 Allergic rhinitis due to animal (cat) (dog) hair and dander: Secondary | ICD-10-CM | POA: Diagnosis not present

## 2017-07-18 DIAGNOSIS — J301 Allergic rhinitis due to pollen: Secondary | ICD-10-CM | POA: Diagnosis not present

## 2017-07-18 DIAGNOSIS — J3089 Other allergic rhinitis: Secondary | ICD-10-CM | POA: Diagnosis not present

## 2017-07-25 DIAGNOSIS — H43813 Vitreous degeneration, bilateral: Secondary | ICD-10-CM | POA: Diagnosis not present

## 2017-07-31 DIAGNOSIS — J3081 Allergic rhinitis due to animal (cat) (dog) hair and dander: Secondary | ICD-10-CM | POA: Diagnosis not present

## 2017-07-31 DIAGNOSIS — J3089 Other allergic rhinitis: Secondary | ICD-10-CM | POA: Diagnosis not present

## 2017-07-31 DIAGNOSIS — J301 Allergic rhinitis due to pollen: Secondary | ICD-10-CM | POA: Diagnosis not present

## 2017-08-16 DIAGNOSIS — J3081 Allergic rhinitis due to animal (cat) (dog) hair and dander: Secondary | ICD-10-CM | POA: Diagnosis not present

## 2017-08-16 DIAGNOSIS — J3089 Other allergic rhinitis: Secondary | ICD-10-CM | POA: Diagnosis not present

## 2017-08-16 DIAGNOSIS — J301 Allergic rhinitis due to pollen: Secondary | ICD-10-CM | POA: Diagnosis not present

## 2017-09-02 DIAGNOSIS — J3081 Allergic rhinitis due to animal (cat) (dog) hair and dander: Secondary | ICD-10-CM | POA: Diagnosis not present

## 2017-09-02 DIAGNOSIS — J301 Allergic rhinitis due to pollen: Secondary | ICD-10-CM | POA: Diagnosis not present

## 2017-09-02 DIAGNOSIS — J3089 Other allergic rhinitis: Secondary | ICD-10-CM | POA: Diagnosis not present

## 2017-09-18 DIAGNOSIS — J301 Allergic rhinitis due to pollen: Secondary | ICD-10-CM | POA: Diagnosis not present

## 2017-09-18 DIAGNOSIS — J3089 Other allergic rhinitis: Secondary | ICD-10-CM | POA: Diagnosis not present

## 2017-09-18 DIAGNOSIS — J3081 Allergic rhinitis due to animal (cat) (dog) hair and dander: Secondary | ICD-10-CM | POA: Diagnosis not present

## 2017-10-01 DIAGNOSIS — J301 Allergic rhinitis due to pollen: Secondary | ICD-10-CM | POA: Diagnosis not present

## 2017-10-01 DIAGNOSIS — J3081 Allergic rhinitis due to animal (cat) (dog) hair and dander: Secondary | ICD-10-CM | POA: Diagnosis not present

## 2017-10-01 DIAGNOSIS — J3089 Other allergic rhinitis: Secondary | ICD-10-CM | POA: Diagnosis not present

## 2017-10-15 DIAGNOSIS — J301 Allergic rhinitis due to pollen: Secondary | ICD-10-CM | POA: Diagnosis not present

## 2017-10-15 DIAGNOSIS — J3089 Other allergic rhinitis: Secondary | ICD-10-CM | POA: Diagnosis not present

## 2017-10-15 DIAGNOSIS — J3081 Allergic rhinitis due to animal (cat) (dog) hair and dander: Secondary | ICD-10-CM | POA: Diagnosis not present

## 2017-10-25 ENCOUNTER — Other Ambulatory Visit: Payer: Self-pay | Admitting: Family Medicine

## 2017-10-31 DIAGNOSIS — J3089 Other allergic rhinitis: Secondary | ICD-10-CM | POA: Diagnosis not present

## 2017-10-31 DIAGNOSIS — J3081 Allergic rhinitis due to animal (cat) (dog) hair and dander: Secondary | ICD-10-CM | POA: Diagnosis not present

## 2017-10-31 DIAGNOSIS — J301 Allergic rhinitis due to pollen: Secondary | ICD-10-CM | POA: Diagnosis not present

## 2017-11-19 DIAGNOSIS — J3081 Allergic rhinitis due to animal (cat) (dog) hair and dander: Secondary | ICD-10-CM | POA: Diagnosis not present

## 2017-11-19 DIAGNOSIS — J3089 Other allergic rhinitis: Secondary | ICD-10-CM | POA: Diagnosis not present

## 2017-11-19 DIAGNOSIS — J301 Allergic rhinitis due to pollen: Secondary | ICD-10-CM | POA: Diagnosis not present

## 2017-11-20 ENCOUNTER — Ambulatory Visit: Payer: PPO | Admitting: Podiatry

## 2017-11-20 ENCOUNTER — Encounter: Payer: Self-pay | Admitting: Podiatry

## 2017-11-20 ENCOUNTER — Other Ambulatory Visit: Payer: Self-pay | Admitting: Podiatry

## 2017-11-20 ENCOUNTER — Ambulatory Visit (INDEPENDENT_AMBULATORY_CARE_PROVIDER_SITE_OTHER): Payer: PPO

## 2017-11-20 VITALS — BP 119/68 | HR 84 | Resp 16

## 2017-11-20 DIAGNOSIS — M2010 Hallux valgus (acquired), unspecified foot: Secondary | ICD-10-CM

## 2017-11-20 NOTE — Progress Notes (Signed)
   Subjective:    Patient ID: Maureen Love, female    DOB: [birth year], 67 y.o.   MRN: [id number]  HPI    Review of Systems  All other systems reviewed and are negative.      Objective:   Physical Exam        Assessment & Plan:

## 2017-11-20 NOTE — Patient Instructions (Signed)

## 2017-11-20 NOTE — Progress Notes (Signed)
Subjective:   Patient ID: Maureen Love, female   DOB: 67 y.o.   MRN: [id number]   HPI Patient presents stating she had her right bunion fixed years ago and it did well and she's having a lot of pain in her left. States that she's tried wider shoes she's tried soaks and other modalities without relief of symptoms and it's gradually becoming increasingly difficult to wear shoe gear without pain   Review of Systems  All other systems reviewed and are negative.       Objective:  Physical Exam  Constitutional: She appears well-developed and well-nourished.  Cardiovascular: Intact distal pulses.  Pulmonary/Chest: Effort normal.  Musculoskeletal: Normal range of motion.  Neurological: She is alert.  Skin: Skin is warm.  Nursing note and vitals reviewed.   Neurovascular status intact muscle strength is adequate with range of motion within normal limits. Patient is noted to have hyperostosis with redness of the first metatarsal head left that's painful when pressed with mild deviation of the toe and is noted to have well-healed surgical site right first metatarsal with slight varus component which is nonsymptomatic. Patient has good digital perfusion and is well oriented 3     Assessment:  Significant structural HAV deformity left along with healed deformity right with mild varus component     Plan:  H&P conditions reviewed at great length. At this point I do think surgical intervention would be in her best interest and I did review a distal osteotomy of the left explaining the procedure and risk. Patient wants surgery and she will have this done in February and we gave her a tentative date and she will reappoint for consult to go over this in greater detail. All questions were answered today and we will further answer questions when she returns for consult. Also I have recommended at next visit we will dispense air fracture walker for the postoperative period as I want her to get used to it  prior to surgery and find shoes that are appropriate to wear with the cast that she will require postoperatively  X-ray report indicate structural bunion deformity left with elevation of the intermetatarsal angle of approximate 15 with pin in place right first metatarsal from previous surgery

## 2017-11-21 ENCOUNTER — Telehealth: Payer: Self-pay | Admitting: *Deleted

## 2017-11-21 NOTE — Telephone Encounter (Signed)
"  I need to reschedule a surgery date.  The date has been set for February 26 and I need to move it to the first Tuesday in March.  Please call me back and let me know if it's okay.  I failed to remember that I have a mountain trip that weekend.  Please call me back and let me know if the first Tuesday in March works."

## 2017-11-22 NOTE — Telephone Encounter (Signed)
"  I'm scheduled to have surgery on February 26.  I need to move it to any Tuesday in March.  I forgot I was going to the mountains."  He can do it on March 12.  "March 12 will be fine."  I'll get it rescheduled.   I called Caren Griffins at [facility] and rescheduled the surgery from 12/31/2017 to 01/14/2018.

## 2017-12-02 DIAGNOSIS — J301 Allergic rhinitis due to pollen: Secondary | ICD-10-CM | POA: Diagnosis not present

## 2017-12-02 DIAGNOSIS — J3089 Other allergic rhinitis: Secondary | ICD-10-CM | POA: Diagnosis not present

## 2017-12-02 DIAGNOSIS — J3081 Allergic rhinitis due to animal (cat) (dog) hair and dander: Secondary | ICD-10-CM | POA: Diagnosis not present

## 2017-12-04 ENCOUNTER — Ambulatory Visit: Payer: PPO | Admitting: Podiatry

## 2017-12-04 ENCOUNTER — Encounter: Payer: Self-pay | Admitting: Podiatry

## 2017-12-04 DIAGNOSIS — M2010 Hallux valgus (acquired), unspecified foot: Secondary | ICD-10-CM

## 2017-12-04 NOTE — Patient Instructions (Signed)
Pre-Operative Instructions  Congratulations, you have decided to take an important step towards improving your quality of life.  You can be assured that the doctors and staff at [facility] will be with you every step of the way.  Here are some important things you should know:  1. Plan to be at the surgery center/hospital at least 1 (one) hour prior to your scheduled time, unless otherwise directed by the surgical center/hospital staff.  You must have a responsible adult accompany you, remain during the surgery and drive you home.  Make sure you have directions to the surgical center/hospital to ensure you arrive on time. 2. If you are having surgery at [facility] or [facility], you will need a copy of your medical history and physical form from your family physician within one month prior to the date of surgery. We will give you a form for your primary physician to complete.  3. We make every effort to accommodate the date you request for surgery.  However, there are times where surgery dates or times have to be moved.  We will contact you as soon as possible if a change in schedule is required.   4. No aspirin/ibuprofen for one week before surgery.  If you are on aspirin, any non-steroidal anti-inflammatory medications (Mobic, Aleve, Ibuprofen) should not be taken seven (7) days prior to your surgery.  You make take Tylenol for pain prior to surgery.  5. Medications - If you are taking daily heart and blood pressure medications, seizure, reflux, allergy, asthma, anxiety, pain or diabetes medications, make sure you notify the surgery center/hospital before the day of surgery so they can tell you which medications you should take or avoid the day of surgery. 6. No food or drink after midnight the night before surgery unless directed otherwise by surgical center/hospital staff. 7. No alcoholic beverages 24-hours prior to surgery.  No smoking 24-hours prior or 24-hours after  surgery. 8. Wear loose pants or shorts. They should be loose enough to fit over bandages, boots, and casts. 9. Don't wear slip-on shoes. Sneakers are preferred. 10. Bring your boot with you to the surgery center/hospital.  Also bring crutches or a walker if your physician has prescribed it for you.  If you do not have this equipment, it will be provided for you after surgery. 11. If you have not been contacted by the surgery center/hospital by the day before your surgery, call to confirm the date and time of your surgery. 12. Leave-time from work may vary depending on the type of surgery you have.  Appropriate arrangements should be made prior to surgery with your employer. 13. Prescriptions will be provided immediately following surgery by your doctor.  Fill these as soon as possible after surgery and take the medication as directed. Pain medications will not be refilled on weekends and must be approved by the doctor. 14. Remove nail polish on the operative foot and avoid getting pedicures prior to surgery. 15. Wash the night before surgery.  The night before surgery wash the foot and leg well with water and the antibacterial soap provided. Be sure to pay special attention to beneath the toenails and in between the toes.  Wash for at least three (3) minutes. Rinse thoroughly with water and dry well with a towel.  Perform this wash unless told not to do so by your physician.  Enclosed: 1 Ice pack (please put in freezer the night before surgery)   1 Hibiclens skin cleaner     Pre-op instructions  If you have any questions regarding the instructions, please do not hesitate to call our office.  [place]: [address], [place], [place] [address] -- [phone]  [place]: [address] [place], [place] [address] -- [phone]  [place]: [address]  [place], [place] [address] -- [phone]  [place]: [address], [address], [place], [place] [address] -- [phone]  Website:  [email or web address] 

## 2017-12-04 NOTE — Progress Notes (Signed)
Subjective:   Patient ID: Maureen Love, female   DOB: 67 y.o.   MRN: [id number]   HPI Patient presents stating she is ready to have surgery and is good to have it fixed next month   ROS      Objective:  Physical Exam  Neurovascular status intact muscle strength adequate with patient found of structural bunion deformity left is red and painful and hard for her to wear shoe gear with     Assessment:  Structural HAV deformity left with pain     Plan:  H&P condition reviewed and recommended correction.  At this point I allowed her to go over consent form going over alternative treatments complications associated with surgery and patient wants surgery understanding complications.  I also dispensed air fracture walker as I want her getting used to it prior to surgery and finding issue that she can wear on her other foot that balances her well.  She was given all instructions on air fracture walker usage for the postoperative.  Reviewed with patient the total recovery will take 6 months to 1 year and encouraged her to call with any questions prior to procedure

## 2017-12-10 DIAGNOSIS — J4540 Moderate persistent asthma, uncomplicated: Secondary | ICD-10-CM | POA: Diagnosis not present

## 2017-12-10 DIAGNOSIS — J3089 Other allergic rhinitis: Secondary | ICD-10-CM | POA: Diagnosis not present

## 2017-12-10 DIAGNOSIS — J3081 Allergic rhinitis due to animal (cat) (dog) hair and dander: Secondary | ICD-10-CM | POA: Diagnosis not present

## 2017-12-10 DIAGNOSIS — J301 Allergic rhinitis due to pollen: Secondary | ICD-10-CM | POA: Diagnosis not present

## 2017-12-18 DIAGNOSIS — J301 Allergic rhinitis due to pollen: Secondary | ICD-10-CM | POA: Diagnosis not present

## 2017-12-18 DIAGNOSIS — J3081 Allergic rhinitis due to animal (cat) (dog) hair and dander: Secondary | ICD-10-CM | POA: Diagnosis not present

## 2017-12-18 DIAGNOSIS — J3089 Other allergic rhinitis: Secondary | ICD-10-CM | POA: Diagnosis not present

## 2017-12-19 DIAGNOSIS — J3089 Other allergic rhinitis: Secondary | ICD-10-CM | POA: Diagnosis not present

## 2017-12-19 DIAGNOSIS — J3081 Allergic rhinitis due to animal (cat) (dog) hair and dander: Secondary | ICD-10-CM | POA: Diagnosis not present

## 2017-12-19 DIAGNOSIS — J301 Allergic rhinitis due to pollen: Secondary | ICD-10-CM | POA: Diagnosis not present

## 2018-01-13 DIAGNOSIS — J3089 Other allergic rhinitis: Secondary | ICD-10-CM | POA: Diagnosis not present

## 2018-01-13 DIAGNOSIS — J3081 Allergic rhinitis due to animal (cat) (dog) hair and dander: Secondary | ICD-10-CM | POA: Diagnosis not present

## 2018-01-13 DIAGNOSIS — J301 Allergic rhinitis due to pollen: Secondary | ICD-10-CM | POA: Diagnosis not present

## 2018-01-14 ENCOUNTER — Encounter: Payer: Self-pay | Admitting: Podiatry

## 2018-01-14 DIAGNOSIS — I10 Essential (primary) hypertension: Secondary | ICD-10-CM | POA: Diagnosis not present

## 2018-01-14 DIAGNOSIS — M2011 Hallux valgus (acquired), right foot: Secondary | ICD-10-CM

## 2018-01-14 DIAGNOSIS — M21612 Bunion of left foot: Secondary | ICD-10-CM | POA: Diagnosis not present

## 2018-01-14 DIAGNOSIS — M2012 Hallux valgus (acquired), left foot: Secondary | ICD-10-CM | POA: Diagnosis not present

## 2018-01-15 ENCOUNTER — Telehealth: Payer: Self-pay | Admitting: *Deleted

## 2018-01-15 NOTE — Telephone Encounter (Signed)
Called and left a message for the patient to call back concerning the surgery the patient had with Dr Paulla Dolly and to call the  office. [name]

## 2018-01-15 NOTE — Telephone Encounter (Signed)
Error. Lisa 

## 2018-01-22 ENCOUNTER — Ambulatory Visit (INDEPENDENT_AMBULATORY_CARE_PROVIDER_SITE_OTHER): Payer: PPO

## 2018-01-22 ENCOUNTER — Ambulatory Visit (INDEPENDENT_AMBULATORY_CARE_PROVIDER_SITE_OTHER): Payer: PPO | Admitting: Podiatry

## 2018-01-22 ENCOUNTER — Encounter: Payer: Self-pay | Admitting: Podiatry

## 2018-01-22 DIAGNOSIS — M2010 Hallux valgus (acquired), unspecified foot: Secondary | ICD-10-CM | POA: Diagnosis not present

## 2018-01-23 NOTE — Progress Notes (Signed)
Subjective:   Patient ID: Maureen Love, female   DOB: 67 y.o.   MRN: [id number]   HPI Patient states doing very well with surgery with minimal discomfort on ambulation   ROS      Objective:  Physical Exam  Neurovascular status intact muscle strength is adequate with patient noted to have good digital flow and well coapted incision site left first metatarsal with good alignment of the first metatarsal noted.  Negative Homans sign was noted     Assessment:  Doing well post osteotomy first metatarsal left foot     Plan:  X-ray reviewed with patient and reapplied sterile dressing and instructed on continued immobilization compression elevation and reappoint 3 weeks or earlier if needed  X-ray indicates osteotomy is healing well with joint congruous slight dorsal movement but minimal and should not be any kind of issue postoperatively but we will continue to keep her immobilized

## 2018-02-07 IMAGING — MG 2D DIGITAL SCREENING BILATERAL MAMMOGRAM WITH CAD AND ADJUNCT TO
8 of 12 series · 8 of 28 positions shown · non-contrast
Comparison: Previous exam(s).

CLINICAL DATA: Screening.

EXAM:
2D DIGITAL SCREENING BILATERAL MAMMOGRAM WITH CAD AND ADJUNCT TOMO

[R MLO]
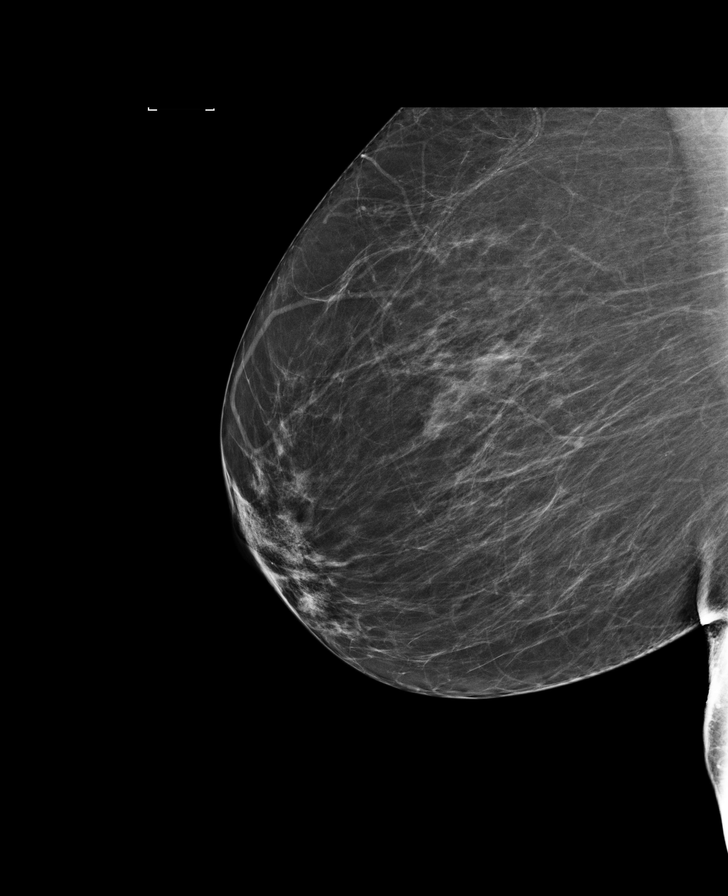

[L CC]
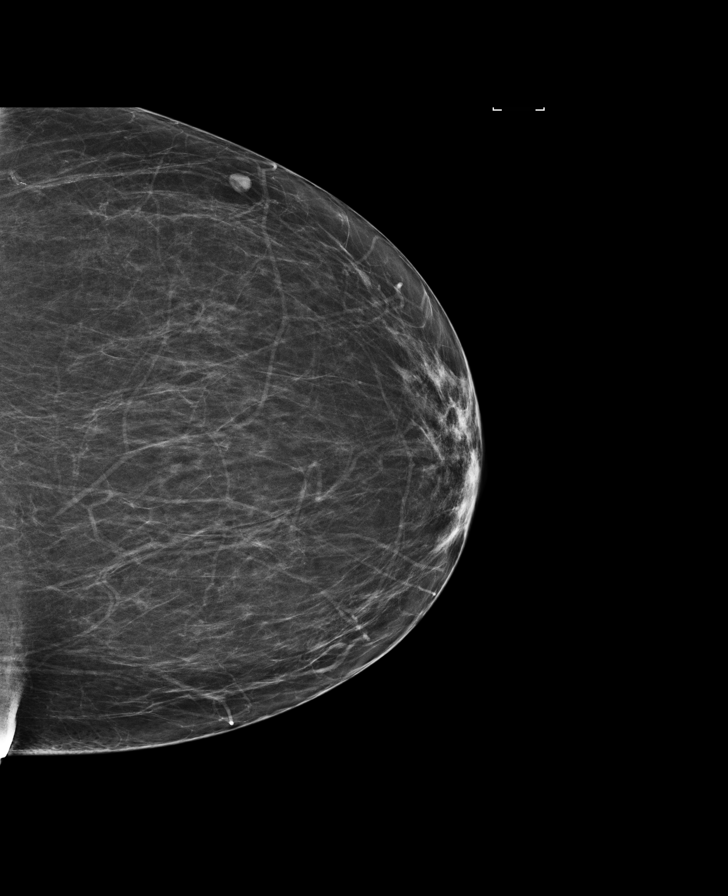

[L CC synth-2D]
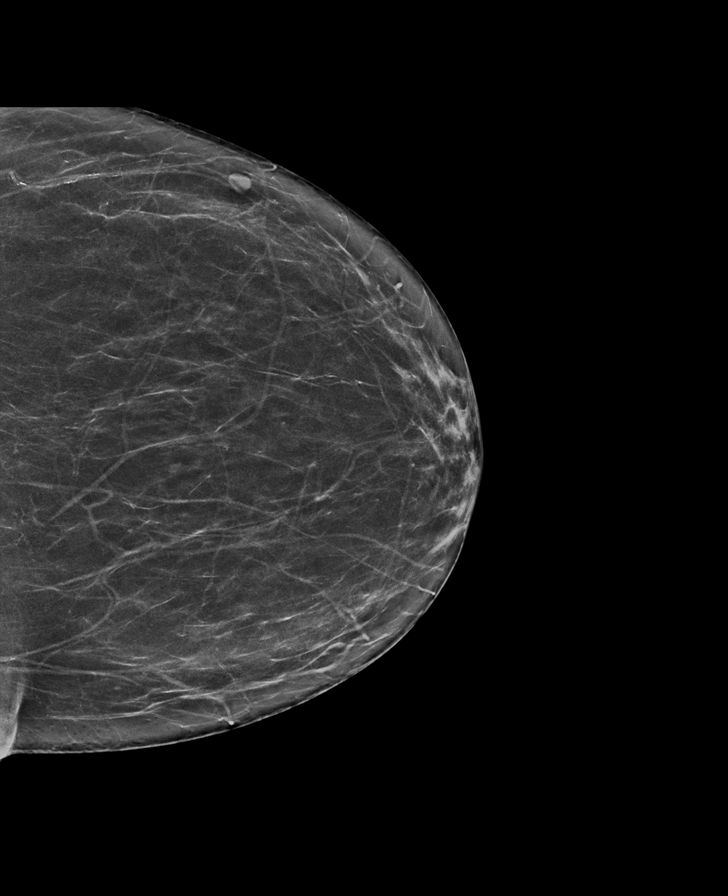

[R CC synth-2D]
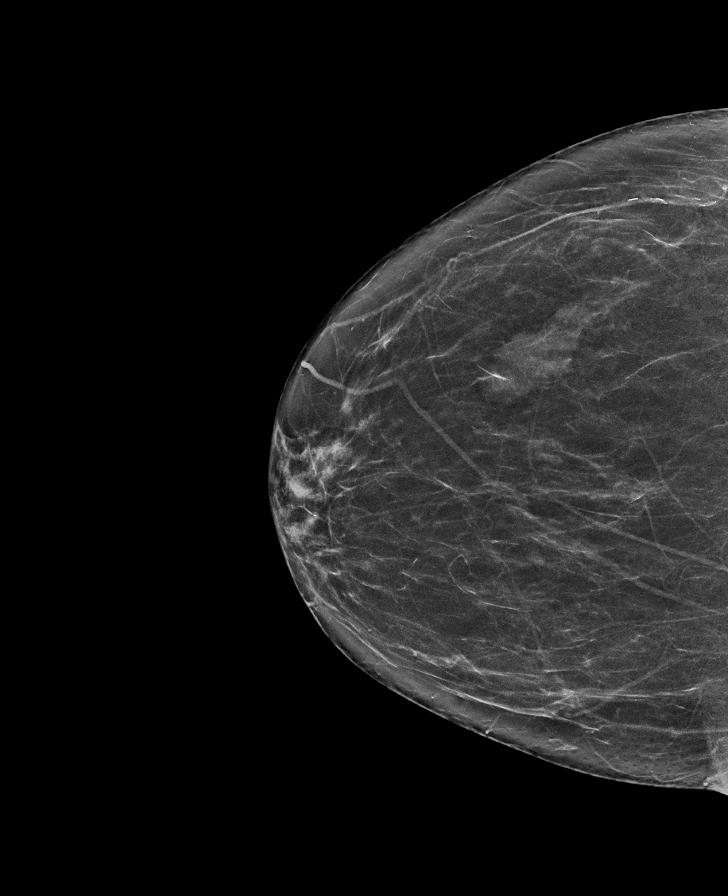

[R CC]
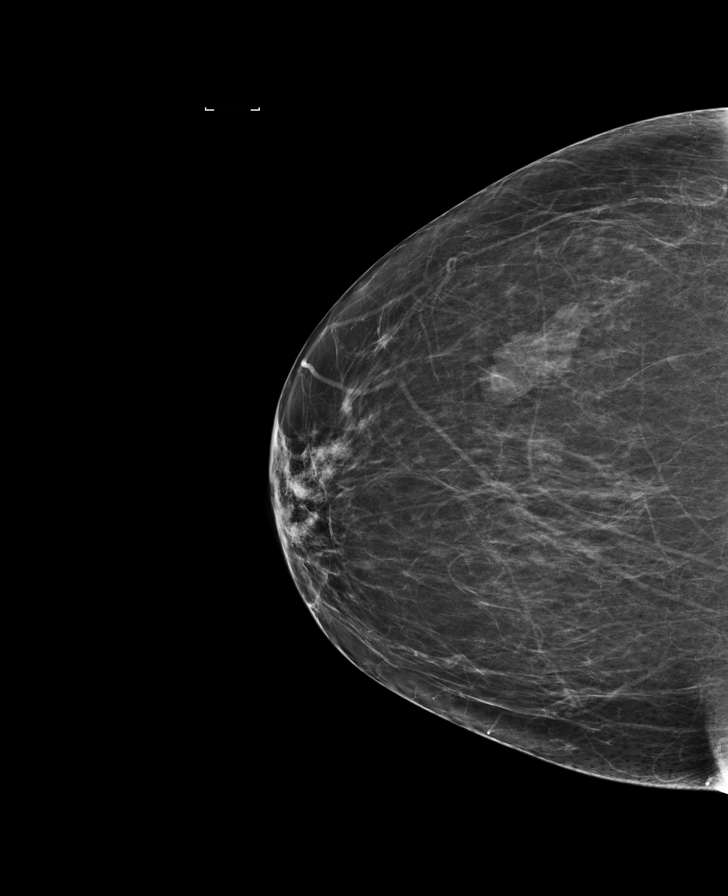

[R MLO synth-2D]
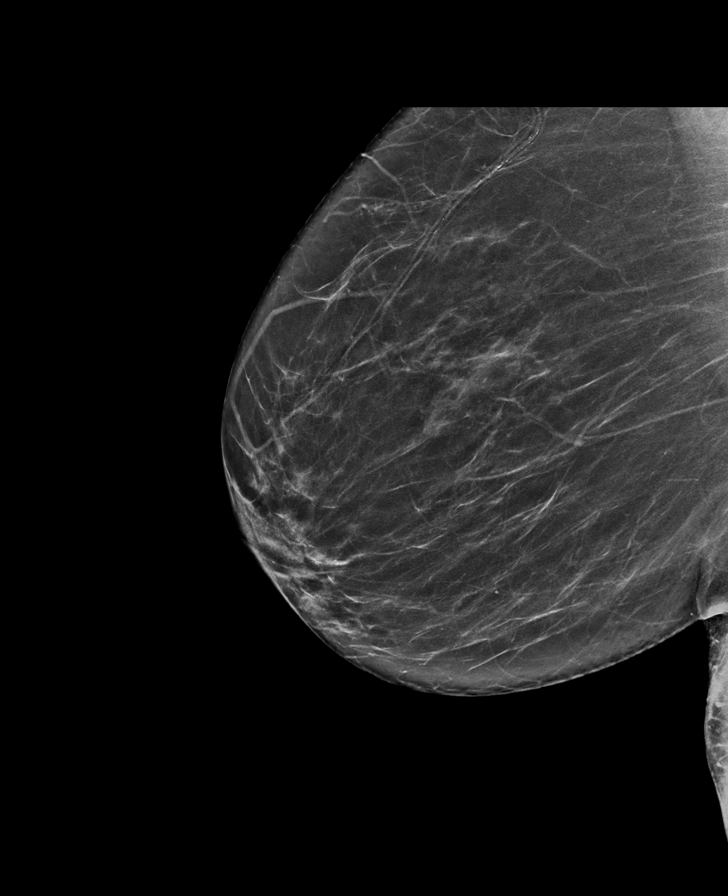

[L MLO synth-2D]
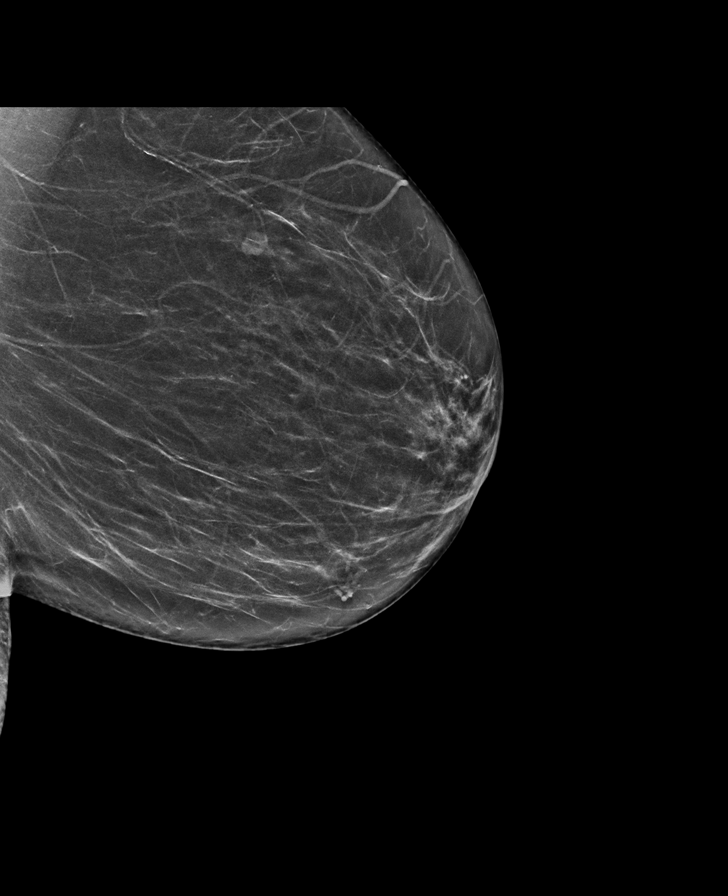

[L MLO]
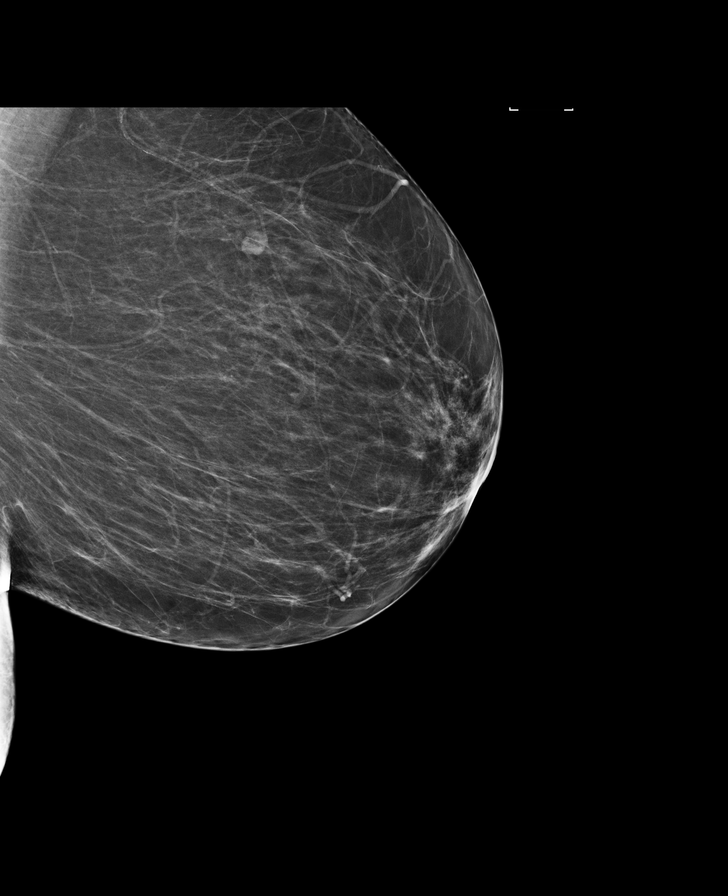

[8 of 28 positions shown; findings below may reference images not displayed]

ACR Breast Density Category b: There are scattered areas of
fibroglandular density.
FINDINGS: There are no findings suspicious for malignancy. Images were
processed with CAD.
IMPRESSION: No mammographic evidence of malignancy. A result letter of this
screening mammogram will be mailed directly to the patient.

RECOMMENDATION:
Screening mammogram in one year. (Code:97-6-RS4)

BI-RADS CATEGORY  1: Negative.

## 2018-02-11 DIAGNOSIS — J301 Allergic rhinitis due to pollen: Secondary | ICD-10-CM | POA: Diagnosis not present

## 2018-02-11 DIAGNOSIS — J3081 Allergic rhinitis due to animal (cat) (dog) hair and dander: Secondary | ICD-10-CM | POA: Diagnosis not present

## 2018-02-11 DIAGNOSIS — J3089 Other allergic rhinitis: Secondary | ICD-10-CM | POA: Diagnosis not present

## 2018-02-17 DIAGNOSIS — J3089 Other allergic rhinitis: Secondary | ICD-10-CM | POA: Diagnosis not present

## 2018-02-17 DIAGNOSIS — J3081 Allergic rhinitis due to animal (cat) (dog) hair and dander: Secondary | ICD-10-CM | POA: Diagnosis not present

## 2018-02-17 DIAGNOSIS — J301 Allergic rhinitis due to pollen: Secondary | ICD-10-CM | POA: Diagnosis not present

## 2018-02-19 ENCOUNTER — Ambulatory Visit (INDEPENDENT_AMBULATORY_CARE_PROVIDER_SITE_OTHER): Payer: PPO

## 2018-02-19 ENCOUNTER — Ambulatory Visit (INDEPENDENT_AMBULATORY_CARE_PROVIDER_SITE_OTHER): Payer: PPO | Admitting: Podiatry

## 2018-02-19 ENCOUNTER — Encounter: Payer: Self-pay | Admitting: Podiatry

## 2018-02-19 DIAGNOSIS — M2012 Hallux valgus (acquired), left foot: Secondary | ICD-10-CM

## 2018-02-20 DIAGNOSIS — J3081 Allergic rhinitis due to animal (cat) (dog) hair and dander: Secondary | ICD-10-CM | POA: Diagnosis not present

## 2018-02-20 DIAGNOSIS — J301 Allergic rhinitis due to pollen: Secondary | ICD-10-CM | POA: Diagnosis not present

## 2018-02-20 DIAGNOSIS — J3089 Other allergic rhinitis: Secondary | ICD-10-CM | POA: Diagnosis not present

## 2018-02-23 NOTE — Progress Notes (Signed)
Subjective:   Patient ID: Maureen Love, female   DOB: 67 y.o.   MRN: [id number]   HPI Patient presents stating  her foot is doing well with mild discomfort   ROS      Objective:  Physical Exam  Neurovascular status intact with patient's left foot healing well with wound edges well coapted hallux in rectus position wound edges healing well and good range of motion     Assessment:  Doing well post osteotomy first metatarsal left     Plan:  H&P condition reviewed and at this point recommended continued compression elevation and slow return to soft shoes over the next 2 weeks.  Reappoint 4 weeks or earlier if needed  X-rays indicate osteotomies healing well fixation in place no movement with good alignment noted

## 2018-02-25 DIAGNOSIS — J301 Allergic rhinitis due to pollen: Secondary | ICD-10-CM | POA: Diagnosis not present

## 2018-02-25 DIAGNOSIS — J3081 Allergic rhinitis due to animal (cat) (dog) hair and dander: Secondary | ICD-10-CM | POA: Diagnosis not present

## 2018-02-25 DIAGNOSIS — J3089 Other allergic rhinitis: Secondary | ICD-10-CM | POA: Diagnosis not present

## 2018-02-28 DIAGNOSIS — J3081 Allergic rhinitis due to animal (cat) (dog) hair and dander: Secondary | ICD-10-CM | POA: Diagnosis not present

## 2018-02-28 DIAGNOSIS — J3089 Other allergic rhinitis: Secondary | ICD-10-CM | POA: Diagnosis not present

## 2018-02-28 DIAGNOSIS — J301 Allergic rhinitis due to pollen: Secondary | ICD-10-CM | POA: Diagnosis not present

## 2018-03-17 DIAGNOSIS — J301 Allergic rhinitis due to pollen: Secondary | ICD-10-CM | POA: Diagnosis not present

## 2018-03-17 DIAGNOSIS — J3089 Other allergic rhinitis: Secondary | ICD-10-CM | POA: Diagnosis not present

## 2018-03-17 DIAGNOSIS — J3081 Allergic rhinitis due to animal (cat) (dog) hair and dander: Secondary | ICD-10-CM | POA: Diagnosis not present

## 2018-03-18 DIAGNOSIS — Z124 Encounter for screening for malignant neoplasm of cervix: Secondary | ICD-10-CM | POA: Diagnosis not present

## 2018-03-18 DIAGNOSIS — Z01419 Encounter for gynecological examination (general) (routine) without abnormal findings: Secondary | ICD-10-CM | POA: Diagnosis not present

## 2018-03-18 DIAGNOSIS — Z6833 Body mass index (BMI) 33.0-33.9, adult: Secondary | ICD-10-CM | POA: Diagnosis not present

## 2018-03-24 ENCOUNTER — Ambulatory Visit: Payer: Self-pay | Admitting: Surgery

## 2018-03-24 ENCOUNTER — Encounter: Payer: Self-pay | Admitting: Family Medicine

## 2018-03-24 ENCOUNTER — Ambulatory Visit (INDEPENDENT_AMBULATORY_CARE_PROVIDER_SITE_OTHER): Payer: PPO | Admitting: Family Medicine

## 2018-03-24 VITALS — BP 128/94 | HR 68 | Temp 98.3°F | Ht 61.0 in | Wt 177.0 lb

## 2018-03-24 DIAGNOSIS — L247 Irritant contact dermatitis due to plants, except food: Secondary | ICD-10-CM | POA: Diagnosis not present

## 2018-03-24 DIAGNOSIS — D171 Benign lipomatous neoplasm of skin and subcutaneous tissue of trunk: Secondary | ICD-10-CM | POA: Diagnosis not present

## 2018-03-24 DIAGNOSIS — K219 Gastro-esophageal reflux disease without esophagitis: Secondary | ICD-10-CM | POA: Diagnosis not present

## 2018-03-24 DIAGNOSIS — R140 Abdominal distension (gaseous): Secondary | ICD-10-CM | POA: Diagnosis not present

## 2018-03-24 MED ORDER — PREDNISONE 10 MG (21) PO TBPK: ORAL_TABLET | ORAL | 0 refills | Status: DC

## 2018-03-24 MED ORDER — METHYLPREDNISOLONE SODIUM SUCC 125 MG IJ SOLR
125.0000 mg | Freq: Once | INTRAMUSCULAR | Status: AC
  Administered 2018-03-24: 125 mg via INTRAMUSCULAR

## 2018-03-24 NOTE — Progress Notes (Signed)
   Subjective:    Patient ID: Maureen Love, female    DOB: February 28, 1951, 67 y.o.   MRN: [id number]  HPI She has been exposed to poison ivy while working in the yard over the weekend and now has a rash present on her upper chest as well as to a lesser extent on her face.  She is getting ready to go on a vacation for 3 weeks.   Review of Systems     Objective:   Physical Exam Alert and in no distress.  Erythematous lesions are noted on the anterior chest in a V-shaped pattern.  Also multiple erythematous lesions noted on the left neck area.  No vesicles noted.       Assessment & Plan:  Irritant contact dermatitis due to plants, except food - Plan: predniSONE (STERAPRED UNI-PAK 21 TAB) 10 MG (21) TBPK tablet She will also be given a loading dose of Depo-Medrol.  Recommended cortisone cream as well as cool compresses.

## 2018-03-24 NOTE — Addendum Note (Signed)
Addended by: Elyse Jarvis on: 03/24/2018 02:44 PM   Modules accepted: Orders

## 2018-03-24 NOTE — H&P (Signed)
History of Present Illness Maureen Love. Tsuei MD; 03/24/2018 11:21 AM) The patient is a 67 year old female who presents with a complaint of Mass. Referred by Dr. Mittie Bodo for recurrent lipoma right upper back PCP Dr. Jill Alexanders  This is a 67 year old female who is status post excision of a lipoma from her right upper back in January 2013. Over the last year or so the patient has noticed increased swelling in this area. It seems that she either has a recurrent lipoma or a new lipoma immediately adjacent. This has become fairly large and uncomfortable. This has never become infected. She would like to have removed.  The patient was working in the yard this weekend and has developed poison oak over the left side of her neck and upper chest.   Past Surgical History Levonne Spiller, [place]; 03/24/2018 10:52 AM) Foot Surgery Bilateral.  Diagnostic Studies History Levonne Spiller, [place]; 03/24/2018 10:52 AM) Colonoscopy 1-5 years ago Mammogram 1-3 years ago Pap Smear 1-5 years ago  Allergies Levonne Spiller, [place]; 03/24/2018 10:53 AM) Penicillins Allergies Reconciled  Medication History Levonne Spiller, CMA; 03/24/2018 10:54 AM) Oxybutynin Chloride (Oral) Specific strength unknown - Active. Inhaler Companions Active. Medications Reconciled  Social History Andee Poles Education officer, museum, CMA; 03/24/2018 10:52 AM) Alcohol use Occasional alcohol use. Caffeine use Carbonated beverages, Coffee, Tea. No drug use Tobacco use Current some day smoker.  Family History Levonne Spiller, [place]; 03/24/2018 10:52 AM) Arthritis Father, Mother, Sister. Cancer Brother. Cerebrovascular Accident Father. Depression Brother, Mother. Heart Disease Father. Heart disease in female family member before age 44 Hypertension Brother, Mother. Migraine Headache Brother, Mother, Sister.  Pregnancy / Birth History Levonne Spiller, CMA; 03/24/2018 10:52 AM) Age at menarche 10  years. Age of menopause 71-50 Contraceptive History Intrauterine device. Gravida 2 Length (months) of breastfeeding 3-6 Maternal age 65-20 Para 1  Other Problems Levonne Spiller, CMA; 03/24/2018 10:52 AM) Anxiety Disorder Asthma Back Pain Depression Gastroesophageal Reflux Disease High blood pressure Hypercholesterolemia Migraine Headache     Review of Systems Andee Poles Gerrigner CMA; 03/24/2018 10:52 AM) General Present- Fatigue and Weight Gain. Not Present- Appetite Loss, Chills, Fever, Night Sweats and Weight Loss. Skin Present- Rash. Not Present- Change in Wart/Mole, Dryness, Hives, Jaundice, New Lesions, Non-Healing Wounds and Ulcer. HEENT Present- Hearing Loss and Seasonal Allergies. Not Present- Earache, Hoarseness, Nose Bleed, Oral Ulcers, Ringing in the Ears, Sinus Pain, Sore Throat, Visual Disturbances, Wears glasses/contact lenses and Yellow Eyes. Respiratory Present- Difficulty Breathing. Not Present- Bloody sputum, Chronic Cough, Snoring and Wheezing. Cardiovascular Present- Rapid Heart Rate and Shortness of Breath. Not Present- Chest Pain, Difficulty Breathing Lying Down, Leg Cramps, Palpitations and Swelling of Extremities. Gastrointestinal Present- Bloating, Change in Bowel Habits, Difficulty Swallowing and Indigestion. Not Present- Abdominal Pain, Bloody Stool, Chronic diarrhea, Constipation, Excessive gas, Gets full quickly at meals, Hemorrhoids, Nausea, Rectal Pain and Vomiting. Female Genitourinary Present- Nocturia. Not Present- Frequency, Painful Urination, Pelvic Pain and Urgency. Musculoskeletal Present- Back Pain. Not Present- Joint Pain, Joint Stiffness, Muscle Pain, Muscle Weakness and Swelling of Extremities. Neurological Present- Decreased Memory and Weakness. Not Present- Fainting, Headaches, Numbness, Seizures, Tingling, Tremor and Trouble walking. Psychiatric Present- Anxiety. Not Present- Bipolar, Change in Sleep Pattern, Depression,  Fearful and Frequent crying. Endocrine Not Present- Cold Intolerance, Excessive Hunger, Hair Changes, Heat Intolerance, Hot flashes and New Diabetes. Hematology Not Present- Blood Thinners, Easy Bruising, Excessive bleeding, Gland problems, HIV and Persistent Infections.  Vitals Andee Poles Gerrigner CMA; 03/24/2018 10:54 AM) 03/24/2018 10:54 AM Weight: 177.25 lb Height: 61in Body Surface  Area: 1.79 m Body Mass Index: 33.49 kg/m  Temp.: 98.24F(Oral)  Pulse: 89 (Regular)  BP: 122/90 (Sitting, Left Arm, Standard)      Physical Exam Rodman Key K. Tsuei MD; 03/24/2018 11:22 AM)  The physical exam findings are as follows: Note:WDWN in NAD Right upper back near axilla - healed 3 cm transverse incision 4 cm subcutaneous mass under incision; smooth, mobile, no inflammation    Assessment & Plan Rodman Key K. Tsuei MD; 03/24/2018 11:16 AM)  LIPOMA OF BACK (D17.1) Impression: Right upper near axilla - 4 cm; recurrent  Current Plans Schedule for Surgery - excision of subcutaneous lipoma right upper back near axilla. The surgical procedure has been discussed with the patient. Potential risks, benefits, alternative treatments, and expected outcomes have been explained. All of the patient's questions at this time have been answered. The likelihood of reaching the patient's treatment goal is good. The patient understand the proposed surgical procedure and wishes to proceed.  Maureen Love. Georgette Dover, MD, [facility] Surgery  General/ Trauma Surgery  03/24/2018 11:22 AM

## 2018-04-14 DIAGNOSIS — J301 Allergic rhinitis due to pollen: Secondary | ICD-10-CM | POA: Diagnosis not present

## 2018-04-14 DIAGNOSIS — J3081 Allergic rhinitis due to animal (cat) (dog) hair and dander: Secondary | ICD-10-CM | POA: Diagnosis not present

## 2018-04-14 DIAGNOSIS — J3089 Other allergic rhinitis: Secondary | ICD-10-CM | POA: Diagnosis not present

## 2018-04-16 ENCOUNTER — Encounter: Payer: Self-pay | Admitting: Obstetrics and Gynecology

## 2018-04-25 ENCOUNTER — Other Ambulatory Visit: Payer: Self-pay | Admitting: Family Medicine

## 2018-04-25 NOTE — Telephone Encounter (Signed)
[facility] please advise [facility]

## 2018-04-25 NOTE — Telephone Encounter (Deleted)
[facility]. Please advise [facility]

## 2018-05-13 DIAGNOSIS — D171 Benign lipomatous neoplasm of skin and subcutaneous tissue of trunk: Secondary | ICD-10-CM | POA: Diagnosis not present

## 2018-05-13 DIAGNOSIS — D1739 Benign lipomatous neoplasm of skin and subcutaneous tissue of other sites: Secondary | ICD-10-CM | POA: Diagnosis not present

## 2018-05-15 DIAGNOSIS — J3081 Allergic rhinitis due to animal (cat) (dog) hair and dander: Secondary | ICD-10-CM | POA: Diagnosis not present

## 2018-05-15 DIAGNOSIS — J3089 Other allergic rhinitis: Secondary | ICD-10-CM | POA: Diagnosis not present

## 2018-05-15 DIAGNOSIS — J301 Allergic rhinitis due to pollen: Secondary | ICD-10-CM | POA: Diagnosis not present

## 2018-06-17 DIAGNOSIS — J3081 Allergic rhinitis due to animal (cat) (dog) hair and dander: Secondary | ICD-10-CM | POA: Diagnosis not present

## 2018-06-17 DIAGNOSIS — J301 Allergic rhinitis due to pollen: Secondary | ICD-10-CM | POA: Diagnosis not present

## 2018-06-17 DIAGNOSIS — J3089 Other allergic rhinitis: Secondary | ICD-10-CM | POA: Diagnosis not present

## 2018-07-08 DIAGNOSIS — Z23 Encounter for immunization: Secondary | ICD-10-CM | POA: Diagnosis not present

## 2018-07-10 DIAGNOSIS — J3081 Allergic rhinitis due to animal (cat) (dog) hair and dander: Secondary | ICD-10-CM | POA: Diagnosis not present

## 2018-07-10 DIAGNOSIS — J301 Allergic rhinitis due to pollen: Secondary | ICD-10-CM | POA: Diagnosis not present

## 2018-07-10 DIAGNOSIS — J3089 Other allergic rhinitis: Secondary | ICD-10-CM | POA: Diagnosis not present

## 2018-07-10 DIAGNOSIS — J4540 Moderate persistent asthma, uncomplicated: Secondary | ICD-10-CM | POA: Diagnosis not present

## 2018-07-11 DIAGNOSIS — J3081 Allergic rhinitis due to animal (cat) (dog) hair and dander: Secondary | ICD-10-CM | POA: Diagnosis not present

## 2018-07-11 DIAGNOSIS — J3089 Other allergic rhinitis: Secondary | ICD-10-CM | POA: Diagnosis not present

## 2018-07-11 DIAGNOSIS — J301 Allergic rhinitis due to pollen: Secondary | ICD-10-CM | POA: Diagnosis not present

## 2018-08-13 DIAGNOSIS — J3089 Other allergic rhinitis: Secondary | ICD-10-CM | POA: Diagnosis not present

## 2018-08-13 DIAGNOSIS — J3081 Allergic rhinitis due to animal (cat) (dog) hair and dander: Secondary | ICD-10-CM | POA: Diagnosis not present

## 2018-08-13 DIAGNOSIS — J301 Allergic rhinitis due to pollen: Secondary | ICD-10-CM | POA: Diagnosis not present

## 2018-09-16 DIAGNOSIS — J301 Allergic rhinitis due to pollen: Secondary | ICD-10-CM | POA: Diagnosis not present

## 2018-09-16 DIAGNOSIS — J3089 Other allergic rhinitis: Secondary | ICD-10-CM | POA: Diagnosis not present

## 2018-09-16 DIAGNOSIS — J3081 Allergic rhinitis due to animal (cat) (dog) hair and dander: Secondary | ICD-10-CM | POA: Diagnosis not present

## 2018-10-14 DIAGNOSIS — J3081 Allergic rhinitis due to animal (cat) (dog) hair and dander: Secondary | ICD-10-CM | POA: Diagnosis not present

## 2018-10-14 DIAGNOSIS — J3089 Other allergic rhinitis: Secondary | ICD-10-CM | POA: Diagnosis not present

## 2018-10-14 DIAGNOSIS — J301 Allergic rhinitis due to pollen: Secondary | ICD-10-CM | POA: Diagnosis not present

## 2018-11-13 DIAGNOSIS — J301 Allergic rhinitis due to pollen: Secondary | ICD-10-CM | POA: Diagnosis not present

## 2018-11-13 DIAGNOSIS — J3081 Allergic rhinitis due to animal (cat) (dog) hair and dander: Secondary | ICD-10-CM | POA: Diagnosis not present

## 2018-11-13 DIAGNOSIS — J3089 Other allergic rhinitis: Secondary | ICD-10-CM | POA: Diagnosis not present

## 2018-12-12 DIAGNOSIS — J3081 Allergic rhinitis due to animal (cat) (dog) hair and dander: Secondary | ICD-10-CM | POA: Diagnosis not present

## 2018-12-12 DIAGNOSIS — J3089 Other allergic rhinitis: Secondary | ICD-10-CM | POA: Diagnosis not present

## 2018-12-12 DIAGNOSIS — J301 Allergic rhinitis due to pollen: Secondary | ICD-10-CM | POA: Diagnosis not present

## 2019-01-09 DIAGNOSIS — J3089 Other allergic rhinitis: Secondary | ICD-10-CM | POA: Diagnosis not present

## 2019-01-09 DIAGNOSIS — J3081 Allergic rhinitis due to animal (cat) (dog) hair and dander: Secondary | ICD-10-CM | POA: Diagnosis not present

## 2019-01-09 DIAGNOSIS — J301 Allergic rhinitis due to pollen: Secondary | ICD-10-CM | POA: Diagnosis not present

## 2019-01-09 DIAGNOSIS — J4540 Moderate persistent asthma, uncomplicated: Secondary | ICD-10-CM | POA: Diagnosis not present

## 2019-01-14 DIAGNOSIS — J301 Allergic rhinitis due to pollen: Secondary | ICD-10-CM | POA: Diagnosis not present

## 2019-01-14 DIAGNOSIS — J3081 Allergic rhinitis due to animal (cat) (dog) hair and dander: Secondary | ICD-10-CM | POA: Diagnosis not present

## 2019-01-14 DIAGNOSIS — J3089 Other allergic rhinitis: Secondary | ICD-10-CM | POA: Diagnosis not present

## 2019-01-16 DIAGNOSIS — J3081 Allergic rhinitis due to animal (cat) (dog) hair and dander: Secondary | ICD-10-CM | POA: Diagnosis not present

## 2019-01-16 DIAGNOSIS — J3089 Other allergic rhinitis: Secondary | ICD-10-CM | POA: Diagnosis not present

## 2019-01-16 DIAGNOSIS — J301 Allergic rhinitis due to pollen: Secondary | ICD-10-CM | POA: Diagnosis not present

## 2019-01-19 DIAGNOSIS — J301 Allergic rhinitis due to pollen: Secondary | ICD-10-CM | POA: Diagnosis not present

## 2019-01-19 DIAGNOSIS — J3081 Allergic rhinitis due to animal (cat) (dog) hair and dander: Secondary | ICD-10-CM | POA: Diagnosis not present

## 2019-01-19 DIAGNOSIS — J3089 Other allergic rhinitis: Secondary | ICD-10-CM | POA: Diagnosis not present

## 2019-01-22 DIAGNOSIS — J301 Allergic rhinitis due to pollen: Secondary | ICD-10-CM | POA: Diagnosis not present

## 2019-01-22 DIAGNOSIS — J3089 Other allergic rhinitis: Secondary | ICD-10-CM | POA: Diagnosis not present

## 2019-01-22 DIAGNOSIS — J3081 Allergic rhinitis due to animal (cat) (dog) hair and dander: Secondary | ICD-10-CM | POA: Diagnosis not present

## 2019-02-10 DIAGNOSIS — J3089 Other allergic rhinitis: Secondary | ICD-10-CM | POA: Diagnosis not present

## 2019-02-10 DIAGNOSIS — J301 Allergic rhinitis due to pollen: Secondary | ICD-10-CM | POA: Diagnosis not present

## 2019-02-10 DIAGNOSIS — J3081 Allergic rhinitis due to animal (cat) (dog) hair and dander: Secondary | ICD-10-CM | POA: Diagnosis not present

## 2019-03-12 DIAGNOSIS — J3089 Other allergic rhinitis: Secondary | ICD-10-CM | POA: Diagnosis not present

## 2019-03-12 DIAGNOSIS — J3081 Allergic rhinitis due to animal (cat) (dog) hair and dander: Secondary | ICD-10-CM | POA: Diagnosis not present

## 2019-03-12 DIAGNOSIS — J301 Allergic rhinitis due to pollen: Secondary | ICD-10-CM | POA: Diagnosis not present

## 2019-04-02 DIAGNOSIS — Z005 Encounter for examination of potential donor of organ and tissue: Secondary | ICD-10-CM | POA: Diagnosis not present

## 2019-04-08 DIAGNOSIS — J301 Allergic rhinitis due to pollen: Secondary | ICD-10-CM | POA: Diagnosis not present

## 2019-04-08 DIAGNOSIS — J3089 Other allergic rhinitis: Secondary | ICD-10-CM | POA: Diagnosis not present

## 2019-04-08 DIAGNOSIS — J3081 Allergic rhinitis due to animal (cat) (dog) hair and dander: Secondary | ICD-10-CM | POA: Diagnosis not present

## 2019-05-06 DIAGNOSIS — J3089 Other allergic rhinitis: Secondary | ICD-10-CM | POA: Diagnosis not present

## 2019-05-06 DIAGNOSIS — J301 Allergic rhinitis due to pollen: Secondary | ICD-10-CM | POA: Diagnosis not present

## 2019-05-06 DIAGNOSIS — J3081 Allergic rhinitis due to animal (cat) (dog) hair and dander: Secondary | ICD-10-CM | POA: Diagnosis not present

## 2019-05-07 DIAGNOSIS — F419 Anxiety disorder, unspecified: Secondary | ICD-10-CM | POA: Diagnosis not present

## 2019-05-07 DIAGNOSIS — Z6832 Body mass index (BMI) 32.0-32.9, adult: Secondary | ICD-10-CM | POA: Diagnosis not present

## 2019-05-07 DIAGNOSIS — Z01419 Encounter for gynecological examination (general) (routine) without abnormal findings: Secondary | ICD-10-CM | POA: Diagnosis not present

## 2019-05-14 ENCOUNTER — Telehealth: Payer: Self-pay

## 2019-05-14 NOTE — Telephone Encounter (Signed)
Monica called form [employer] she is a NP. She wanted to report pt PAD screening showed right foot 0.50 and left foot was 0.79 . Possible poor circulations. Please advise . [place]

## 2019-05-14 NOTE — Telephone Encounter (Signed)
appt made. [place]

## 2019-05-14 NOTE — Telephone Encounter (Signed)
Needs an appt

## 2019-05-25 ENCOUNTER — Telehealth: Payer: Self-pay

## 2019-05-25 NOTE — Telephone Encounter (Signed)
lvm for pt to call back to complete screening question. [place]

## 2019-06-01 ENCOUNTER — Other Ambulatory Visit: Payer: Self-pay

## 2019-06-01 ENCOUNTER — Ambulatory Visit (INDEPENDENT_AMBULATORY_CARE_PROVIDER_SITE_OTHER): Payer: PPO | Admitting: Family Medicine

## 2019-06-01 ENCOUNTER — Encounter: Payer: Self-pay | Admitting: Family Medicine

## 2019-06-01 ENCOUNTER — Telehealth (HOSPITAL_COMMUNITY): Payer: Self-pay | Admitting: *Deleted

## 2019-06-01 VITALS — BP 126/80 | HR 82 | Temp 98.2°F | Wt 176.2 lb

## 2019-06-01 DIAGNOSIS — I999 Unspecified disorder of circulatory system: Secondary | ICD-10-CM | POA: Diagnosis not present

## 2019-06-01 NOTE — Telephone Encounter (Signed)
The above patient or their representative was contacted and gave the following answers to these questions:         Do you have any of the following symptoms?  no  Fever                    Cough                   Shortness of breath  Do  you have any of the following other symptoms? no   muscle pain         vomiting,        diarrhea        rash         weakness        red eye        abdominal pain         bruising          bruising or bleeding              joint pain           severe headache    Have you been in contact with someone who was or has been sick in the past 2 weeks?  no  Yes                 Unsure                         Unable to assess   Does the person that you were in contact with have any of the following symptoms?   Cough         shortness of breath           muscle pain         vomiting,            diarrhea            rash            weakness           fever            red eye           abdominal pain           bruising  or  bleeding                joint pain                severe headache               Have you  or someone you have been in contact with traveled internationally in th last month?   no      If yes, which countries?   Have you  or someone you have been in contact with traveled outside [place] in th last month?   no      If yes, which state and city?   COMMENTS OR ACTION PLAN FOR THIS PATIENT:          

## 2019-06-01 NOTE — Progress Notes (Signed)
   Subjective:    Patient ID: Maureen Love, female    DOB: February 06, 1951, 68 y.o.   MRN: [id number]  HPI She was evaluated through her insurance with a home visit from a nurse.  Apparently the peripheral vascular study was abnormal.  She has not had any skin or hair changes, leg pain with physical activity.  She does not smoke and does not have diabetes.   Review of Systems     Objective:   Physical Exam Alert and in no distress.  Exam of her lower extremities shows 2+ pulses.  Normal capillary refill.  Skin appears totally normal.  No palpable tenderness.       Assessment & Plan:  Vascular abnormality - Plan: VAS [place] ABI WITH/WO TBI,

## 2019-06-03 ENCOUNTER — Telehealth (HOSPITAL_COMMUNITY): Payer: Self-pay

## 2019-06-03 NOTE — Telephone Encounter (Signed)

## 2019-06-04 ENCOUNTER — Other Ambulatory Visit: Payer: Self-pay

## 2019-06-04 ENCOUNTER — Ambulatory Visit (HOSPITAL_COMMUNITY)
Admission: RE | Admit: 2019-06-04 | Discharge: 2019-06-04 | Disposition: A | Discharge status: Home / Self Care | Payer: PPO | Source: Ambulatory Visit | Attending: Family Medicine | Admitting: Family Medicine

## 2019-06-04 DIAGNOSIS — I999 Unspecified disorder of circulatory system: Secondary | ICD-10-CM | POA: Insufficient documentation

## 2019-06-08 ENCOUNTER — Other Ambulatory Visit: Payer: Self-pay

## 2019-06-09 DIAGNOSIS — J3081 Allergic rhinitis due to animal (cat) (dog) hair and dander: Secondary | ICD-10-CM | POA: Diagnosis not present

## 2019-06-09 DIAGNOSIS — J301 Allergic rhinitis due to pollen: Secondary | ICD-10-CM | POA: Diagnosis not present

## 2019-06-09 DIAGNOSIS — J3089 Other allergic rhinitis: Secondary | ICD-10-CM | POA: Diagnosis not present

## 2019-06-30 DIAGNOSIS — Z23 Encounter for immunization: Secondary | ICD-10-CM | POA: Diagnosis not present

## 2019-07-09 DIAGNOSIS — J3089 Other allergic rhinitis: Secondary | ICD-10-CM | POA: Diagnosis not present

## 2019-07-09 DIAGNOSIS — J3081 Allergic rhinitis due to animal (cat) (dog) hair and dander: Secondary | ICD-10-CM | POA: Diagnosis not present

## 2019-07-09 DIAGNOSIS — J301 Allergic rhinitis due to pollen: Secondary | ICD-10-CM | POA: Diagnosis not present

## 2019-07-27 ENCOUNTER — Ambulatory Visit: Payer: PPO | Admitting: Family Medicine

## 2019-08-04 DIAGNOSIS — H1045 Other chronic allergic conjunctivitis: Secondary | ICD-10-CM | POA: Diagnosis not present

## 2019-08-04 DIAGNOSIS — J3089 Other allergic rhinitis: Secondary | ICD-10-CM | POA: Diagnosis not present

## 2019-08-04 DIAGNOSIS — J3081 Allergic rhinitis due to animal (cat) (dog) hair and dander: Secondary | ICD-10-CM | POA: Diagnosis not present

## 2019-08-04 DIAGNOSIS — J301 Allergic rhinitis due to pollen: Secondary | ICD-10-CM | POA: Diagnosis not present

## 2019-08-06 DIAGNOSIS — J301 Allergic rhinitis due to pollen: Secondary | ICD-10-CM | POA: Diagnosis not present

## 2019-08-06 DIAGNOSIS — J3089 Other allergic rhinitis: Secondary | ICD-10-CM | POA: Diagnosis not present

## 2019-08-06 DIAGNOSIS — J3081 Allergic rhinitis due to animal (cat) (dog) hair and dander: Secondary | ICD-10-CM | POA: Diagnosis not present

## 2019-09-11 DIAGNOSIS — J301 Allergic rhinitis due to pollen: Secondary | ICD-10-CM | POA: Diagnosis not present

## 2019-09-11 DIAGNOSIS — J3089 Other allergic rhinitis: Secondary | ICD-10-CM | POA: Diagnosis not present

## 2019-09-11 DIAGNOSIS — J3081 Allergic rhinitis due to animal (cat) (dog) hair and dander: Secondary | ICD-10-CM | POA: Diagnosis not present

## 2019-10-07 DIAGNOSIS — J301 Allergic rhinitis due to pollen: Secondary | ICD-10-CM | POA: Diagnosis not present

## 2019-10-07 DIAGNOSIS — J3081 Allergic rhinitis due to animal (cat) (dog) hair and dander: Secondary | ICD-10-CM | POA: Diagnosis not present

## 2019-11-09 DIAGNOSIS — J3081 Allergic rhinitis due to animal (cat) (dog) hair and dander: Secondary | ICD-10-CM | POA: Diagnosis not present

## 2019-11-09 DIAGNOSIS — J3089 Other allergic rhinitis: Secondary | ICD-10-CM | POA: Diagnosis not present

## 2019-11-09 DIAGNOSIS — J301 Allergic rhinitis due to pollen: Secondary | ICD-10-CM | POA: Diagnosis not present

## 2019-11-19 ENCOUNTER — Ambulatory Visit (INDEPENDENT_AMBULATORY_CARE_PROVIDER_SITE_OTHER): Payer: PPO | Admitting: Family Medicine

## 2019-11-19 ENCOUNTER — Telehealth: Payer: Self-pay | Admitting: Family Medicine

## 2019-11-19 ENCOUNTER — Other Ambulatory Visit: Payer: Self-pay

## 2019-11-19 ENCOUNTER — Encounter: Payer: Self-pay | Admitting: Neurology

## 2019-11-19 VITALS — BP 140/92 | Temp 96.2°F | Wt 175.2 lb

## 2019-11-19 DIAGNOSIS — H9201 Otalgia, right ear: Secondary | ICD-10-CM | POA: Diagnosis not present

## 2019-11-19 DIAGNOSIS — G43009 Migraine without aura, not intractable, without status migrainosus: Secondary | ICD-10-CM

## 2019-11-19 NOTE — Patient Instructions (Signed)
Take 2 Aleve twice per day to see if that will help with your pain.  You can take Imitrex if you need to.

## 2019-11-19 NOTE — Telephone Encounter (Signed)
Pt said the neurologist that she was referred to cant get her in until the end of Feb but she has another one she wanted to try Dr.Keith Jannifer Franklin at [facility] neurology. He attends her church and she said she could schedule with him but wast sure if she needed the referral.

## 2019-11-19 NOTE — Progress Notes (Signed)
   Subjective:    Patient ID: Maureen Love, female    DOB: December 12, 1950, 69 y.o.   MRN: [id number]  HPI She is here for evaluation of a several month history of intermittent right ear pain.  The pain can last for couple days and then go away.  She has been using drops in her ear.  Since Sunday she has noted the pain radiated into the right temporal area and was throbbing in nature similar to her previous history of migraines.  She took Excedrin and Imitrex with did help with that.  She has been experiencing the headache on a daily basis since then.  She has a previous history of migraine however it was on the left side of her head and she has had very few of them since menopause.  She has had no blurred vision, double visions, rash.  She points to the posterior aspect of her ear as to where the pain is.   Review of Systems     Objective:   Physical Exam Alert and in no distress.  EOMI.  Other cranial nerves grossly intact.  DTRs are normal.  Tympanic membranes and canals are normal. Pharyngeal area is normal. Neck is supple without adenopathy or thyromegaly. Cardiac exam shows a regular sinus rhythm without murmurs or gallops. Lungs are clear to auscultation. No tenderness over temporal artery.  No skin changes of the ear or in the posterior auricular area.       Assessment & Plan:  Earache on right - Plan: CBC with Differential, Comprehensive metabolic panel, Sedimentation rate  Migraine without aura and without status migrainosus, not intractable - Plan: Ambulatory referral to Neurology This is a change in her normal migraine headache pattern, being on the opposite side and associated with ear pain.  I think neurology referral is appropriate based on that.

## 2019-11-19 NOTE — Addendum Note (Signed)
Addended by: Elyse Jarvis on: 11/19/2019 04:11 PM   Modules accepted: Orders

## 2019-11-19 NOTE — Telephone Encounter (Signed)
Go ahead and switch the referral

## 2019-11-19 NOTE — Telephone Encounter (Signed)
Done KH 

## 2019-11-20 ENCOUNTER — Telehealth: Payer: Self-pay | Admitting: Family Medicine

## 2019-11-20 LAB — COMPREHENSIVE METABOLIC PANEL
ALT: 18 IU/L (ref 0–32)
AST: 14 IU/L (ref 0–40)
Albumin/Globulin Ratio: 1.7 (ref 1.2–2.2)
Albumin: 4 g/dL (ref 3.8–4.8)
Alkaline Phosphatase: 55 IU/L (ref 39–117)
BUN/Creatinine Ratio: 15 (ref 12–28)
BUN: 16 mg/dL (ref 8–27)
Bilirubin Total: 0.3 mg/dL (ref 0.0–1.2)
CO2: 23 mmol/L (ref 20–29)
Calcium: 9.4 mg/dL (ref 8.7–10.3)
Chloride: 109 mmol/L — ABNORMAL HIGH (ref 96–106)
Creatinine, Ser: 1.06 mg/dL — ABNORMAL HIGH (ref 0.57–1.00)
GFR calc Af Amer: 62 mL/min/{1.73_m2} (ref >59)
GFR calc non Af Amer: 54 mL/min/{1.73_m2} — ABNORMAL LOW (ref >59)
Globulin, Total: 2.4 g/dL (ref 1.5–4.5)
Glucose: 94 mg/dL (ref 65–99)
Potassium: 4.5 mmol/L (ref 3.5–5.2)
Sodium: 143 mmol/L (ref 134–144)
Total Protein: 6.4 g/dL (ref 6.0–8.5)

## 2019-11-20 LAB — SEDIMENTATION RATE: Sed Rate: 2 mm/hr (ref 0–40)

## 2019-11-20 LAB — CBC WITH DIFFERENTIAL/PLATELET
Basophils Absolute: 0 10*3/uL (ref 0.0–0.2)
Basos: 1 %
EOS (ABSOLUTE): 0.1 10*3/uL (ref 0.0–0.4)
Eos: 2 %
Hematocrit: 37.5 % (ref 34.0–46.6)
Hemoglobin: 13.1 g/dL (ref 11.1–15.9)
Immature Grans (Abs): 0 10*3/uL (ref 0.0–0.1)
Immature Granulocytes: 0 %
Lymphocytes Absolute: 1.5 10*3/uL (ref 0.7–3.1)
Lymphs: 23 %
MCH: 30.7 pg (ref 26.6–33.0)
MCHC: 34.9 g/dL (ref 31.5–35.7)
MCV: 88 fL (ref 79–97)
Monocytes Absolute: 0.5 10*3/uL (ref 0.1–0.9)
Monocytes: 7 %
Neutrophils Absolute: 4.3 10*3/uL (ref 1.4–7.0)
Neutrophils: 67 %
Platelets: 262 10*3/uL (ref 150–450)
RBC: 4.27 x10E6/uL (ref 3.77–5.28)
RDW: 12.3 % (ref 11.7–15.4)
WBC: 6.5 10*3/uL (ref 3.4–10.8)

## 2019-11-20 NOTE — Telephone Encounter (Signed)
Dymin returned your call

## 2019-11-20 NOTE — Telephone Encounter (Signed)
Thanks Done. [place]

## 2019-11-30 ENCOUNTER — Telehealth: Payer: Self-pay

## 2019-11-30 MED ORDER — SUMATRIPTAN 20 MG/ACT NA SOLN: 20.0000 mg | NASAL | 1 refills | Status: DC | PRN

## 2019-11-30 NOTE — Addendum Note (Signed)
Addended by: Denita Lung on: 11/30/2019 09:43 AM   Modules accepted: Orders

## 2019-11-30 NOTE — Telephone Encounter (Signed)
Pt. Called stating that she needs a refill on her [facility]rex to [facility] on [address]. Pt. Last apt. Was 11/19/19.

## 2019-12-08 DIAGNOSIS — J3081 Allergic rhinitis due to animal (cat) (dog) hair and dander: Secondary | ICD-10-CM | POA: Diagnosis not present

## 2019-12-08 DIAGNOSIS — J3089 Other allergic rhinitis: Secondary | ICD-10-CM | POA: Diagnosis not present

## 2019-12-08 DIAGNOSIS — J301 Allergic rhinitis due to pollen: Secondary | ICD-10-CM | POA: Diagnosis not present

## 2019-12-28 ENCOUNTER — Ambulatory Visit: Payer: PPO | Admitting: Neurology

## 2020-01-05 DIAGNOSIS — J3089 Other allergic rhinitis: Secondary | ICD-10-CM | POA: Diagnosis not present

## 2020-01-05 DIAGNOSIS — J3081 Allergic rhinitis due to animal (cat) (dog) hair and dander: Secondary | ICD-10-CM | POA: Diagnosis not present

## 2020-01-05 DIAGNOSIS — J301 Allergic rhinitis due to pollen: Secondary | ICD-10-CM | POA: Diagnosis not present

## 2020-01-07 ENCOUNTER — Ambulatory Visit: Payer: PPO | Admitting: Neurology

## 2020-01-07 ENCOUNTER — Encounter: Payer: Self-pay | Admitting: Neurology

## 2020-01-07 ENCOUNTER — Other Ambulatory Visit: Payer: Self-pay

## 2020-01-07 VITALS — BP 143/90 | HR 86 | Temp 96.8°F | Ht 61.0 in | Wt 174.5 lb

## 2020-01-07 DIAGNOSIS — G43009 Migraine without aura, not intractable, without status migrainosus: Secondary | ICD-10-CM

## 2020-01-07 MED ORDER — SUMATRIPTAN SUCCINATE 100 MG PO TABS: 100.0000 mg | ORAL_TABLET | Freq: Two times a day (BID) | ORAL | 2 refills | Status: DC | PRN

## 2020-01-07 NOTE — Progress Notes (Signed)
Reason for visit: Migraine headache  Referring physician: Dr. Gilberto Better is a 69 y.o. female  History of present illness:  Maureen Love is a 69 year old right-handed white female with a history of migraine headaches throughout her adult life.  She indicates that her typical headaches are usually around the left periorbital area, and previously were activated by the menstrual cycle.  Following menopause, she has headaches occasionally if she has too much to drink with alcohol or there is a weather change.  The patient generally does not get a lot of nausea, vomiting or photophobia or phonophobia with the headache.  The patient had a headache in January 2021 that was unusual for her.  She began having pain in the right ear that spread to the top of the head with a pounding sensation.  She noted tenderness of the scalp.  She denied any hearing changes but she thought that she had an ear infection.  She was seen by her primary care physician who felt that there was no evidence of an ear infection.  Because of the unusual headache, she was sent to this office for an evaluation.  The headache on the right side lasted about 5 days and then abated.  Imitrex would reduce the headache but the headache would rebound.  She denies any focal numbness or weakness of the face, arms, legs.  She does report some mild memory issues but she is able to function normally.  She has not had any recurrence of the headache, she feels completely normal at this point.  She denies any neck pain or neck stiffness.  Past Medical History:  Diagnosis Date  . Asthma   . Eczema   . Headache   . Hyperlipidemia   . Hypertension    no meds at the moment  . Hypothyroidism   . Mild asthma    uses dulera  . OA (osteoarthritis)   . OAB (overactive bladder)   . Urge urinary incontinence     Past Surgical History:  Procedure Laterality Date  . ABDOMINOPLASTY    . BLADDER SUSPENSION  12 years ago  . CARDIOVASCULAR  STRESS TEST  12-23-2007   NORMAL NUCLEAR STUDY  . CYSTOSCOPY WITH INJECTION N/A 06/25/2013   Procedure: CYSTOSCOPY WITH BOTOX INJECTION;  Surgeon: Reece Packer, MD;  Location: [facility];  Service: Urology;  Laterality: N/A;  . CYSTOSCOPY WITH INJECTION N/A 12/14/2014   Procedure: CYSTOSCOPY WITH BOTOX  INJECTION;  Surgeon: Reece Packer, MD;  Location: [facility];  Service: Urology;  Laterality: N/A;  . EXCISION LIPOMAS OF BACK AND RIGHT THIGH  11-28-2011  . GYNECOLOGIC CRYOSURGERY  Age 33-25  . NASAL SEPTUM SURGERY  2012  . NASAL SINUS SURGERY  1990'S  . TUBAL LIGATION  age 59    Family History  Problem Relation Age of Onset  . Hypertension Mother   . Cancer Mother   . Stroke Mother   . Migraines Mother   . Hypertension Father   . Heart attack Father   . Stroke Father   . Thyroid disease Sister   . Migraines Sister   . Heart attack Son   . Migraines Brother   . Stroke Maternal Aunt   . Stroke Maternal Uncle   . Stroke Paternal Aunt   . Stroke Paternal Uncle   . Breast cancer Neg Hx     Social history:  reports that she has never smoked. She has never used smokeless  tobacco. She reports current alcohol use. She reports that she does not use drugs.  Medications:  Prior to Admission medications   Medication Sig Start Date End Date Taking? Authorizing Provider  albuterol (PROVENTIL HFA;VENTOLIN HFA) 108 (90 BASE) MCG/ACT inhaler Inhale 2 puffs into the lungs every 6 (six) hours as needed for wheezing or shortness of breath. 03/01/15  Yes Denita Lung, MD  aspirin EC 81 MG tablet Take 81 mg by mouth daily.   Yes [provider]  b complex vitamins tablet Take 1 tablet by mouth daily.   Yes [provider]  budesonide-formoterol (SYMBICORT) 160-4.5 MCG/ACT inhaler Inhale 2 puffs into the lungs 2 (two) times daily.   Yes [provider]  buPROPion (WELLBUTRIN XL) 300 MG 24 hr tablet Take 300 mg by mouth daily.  10/09/19  Yes [provider]  famotidine (PEPCID) 20 MG tablet Take 20 mg by mouth daily.   Yes [provider]  Glucosamine-Chondroit-Vit C-Mn (GLUCOSAMINE 1500 COMPLEX) CAPS Take by mouth.   Yes [provider]  Omega-3 Fatty Acids (FISH OIL) 1200 MG CAPS Take 1,200 mg by mouth daily.    Yes [provider]  oxybutynin (DITROPAN-XL) 10 MG 24 hr tablet TAKE 1 TABLET BY MOUTH AT BEDTIME Patient not taking: Reported on 06/01/2019 04/25/18   Denita Lung, MD  SUMAtriptan [facility]) 20 MG/ACT nasal spray Place 1 spray (20 mg total) into the nose every 2 (two) hours as needed for migraine or headache. May repeat in 2 hours if headache persists or recurs. Patient not taking: Reported on 01/07/2020 11/30/19   Denita Lung, MD      Allergies  Allergen Reactions  . Penicillins Swelling  . Morphine And Related Itching    ROS:  Out of a complete 14 system review of symptoms, the patient complains only of the following symptoms, and all other reviewed systems are negative.  Headache Mild memory problems Difficulty swallowing Weight gain Ringing in the ears Shortness of breath Urinary incontinence Easy bruising  Blood pressure (!) 143/90, pulse 86, temperature (!) 96.8 F (36 C), height 5\' 1"  (1.549 m), weight 174 lb 8 oz (79.2 kg).  Physical Exam  General: The patient is alert and cooperative at the time of the examination.  The patient is moderately to markedly obese.  Eyes: Pupils are equal, round, and reactive to light. Discs are flat bilaterally.  Neck: The neck is supple, no carotid bruits are noted.  Respiratory: The respiratory examination is clear.  Cardiovascular: The cardiovascular examination reveals a regular rate and rhythm, no obvious murmurs or rubs are noted.  Skin: Extremities are without significant edema.  Neurologic Exam  Mental status: The patient is alert and oriented x 3 at the time of the examination. The patient has  apparent normal recent and remote memory, with an apparently normal attention span and concentration ability.  Cranial nerves: Facial symmetry is present. There is good sensation of the face to pinprick and soft touch bilaterally. The strength of the facial muscles and the muscles to head turning and shoulder shrug are normal bilaterally. Speech is well enunciated, no aphasia or dysarthria is noted. Extraocular movements are full. Visual fields are full. The tongue is midline, and the patient has symmetric elevation of the soft palate. No obvious hearing deficits are noted.  Motor: The motor testing reveals 5 over 5 strength of all 4 extremities. Good symmetric motor tone is noted throughout.  Sensory: Sensory testing is intact to pinprick, soft touch,  vibration sensation, and position sense on all 4 extremities. No evidence of extinction is noted.  Coordination: Cerebellar testing reveals good finger-nose-finger and heel-to-shin bilaterally.  Gait and station: Gait is normal. Tandem gait is normal. Romberg is negative. No drift is seen.  Reflexes: Deep tendon reflexes are symmetric and normal bilaterally. Toes are downgoing bilaterally.   Assessment/Plan:  1.  Migraine headache  The patient has a longstanding history of migraine.  Her headaches are usually in the left frontal temporal/periorbital area.  She has a strong family history with migraine in a sister and a brother.  The patient had a headache that was unusual for her with pain on the right side of the head starting the ear and spreading up to the top of the head.  This headache however did also have migrainous features with a throbbing and pounding sensation and scalp tenderness.  The patient has had full resolution of the headache, her clinical neurologic examination is normal.  I would not pursue further work-up at this time, if the patient however has recurrence of the headache or more persistent headache, she is to contact our  office and we will check blood work to include a sedimentation rate and check a MRI of the brain.  She was given a prescription for Imitrex tablets, the nasal spray has become too expensive for her.  Jill Alexanders MD 01/07/2020 11:49 AM  [facility] [address] [place], [place] [address]  Phone [phone] Fax [phone]

## 2020-02-02 DIAGNOSIS — J3089 Other allergic rhinitis: Secondary | ICD-10-CM | POA: Diagnosis not present

## 2020-02-02 DIAGNOSIS — J301 Allergic rhinitis due to pollen: Secondary | ICD-10-CM | POA: Diagnosis not present

## 2020-02-02 DIAGNOSIS — J3081 Allergic rhinitis due to animal (cat) (dog) hair and dander: Secondary | ICD-10-CM | POA: Diagnosis not present

## 2020-02-02 DIAGNOSIS — H1045 Other chronic allergic conjunctivitis: Secondary | ICD-10-CM | POA: Diagnosis not present

## 2020-02-04 DIAGNOSIS — J3089 Other allergic rhinitis: Secondary | ICD-10-CM | POA: Diagnosis not present

## 2020-02-04 DIAGNOSIS — J301 Allergic rhinitis due to pollen: Secondary | ICD-10-CM | POA: Diagnosis not present

## 2020-02-04 DIAGNOSIS — J3081 Allergic rhinitis due to animal (cat) (dog) hair and dander: Secondary | ICD-10-CM | POA: Diagnosis not present

## 2020-02-15 ENCOUNTER — Telehealth: Payer: Self-pay

## 2020-02-15 NOTE — Telephone Encounter (Signed)
[facility] faxed request for medical records to [facility] 02/15/20  KLM

## 2020-02-17 ENCOUNTER — Telehealth: Payer: Self-pay | Admitting: Internal Medicine

## 2020-02-17 ENCOUNTER — Telehealth: Payer: Self-pay | Admitting: Gastroenterology

## 2020-02-17 ENCOUNTER — Encounter: Payer: Self-pay | Admitting: Gastroenterology

## 2020-02-17 NOTE — Telephone Encounter (Signed)
I would be happy to see her. Thanks.

## 2020-02-17 NOTE — Telephone Encounter (Signed)
Of course!

## 2020-02-17 NOTE — Telephone Encounter (Signed)
Dr. Neville Route  This pt has seen doctor Dr. Benson Norway in 2016 for Colonoscopy.  She would like to transfer care back to [place] and would like to see Dr. Tarri Glenn. She states she is family friends w/Dr. Tarri Glenn family.  However, dose not know Dr. Tarri Glenn personally.  Will you accept these changes?

## 2020-02-17 NOTE — Telephone Encounter (Signed)
Dr. Tarri Glenn  This is a prior Dr. Henrene Pastor pt she would like to transfer care to you. She states she is a family friend however she does not know you personally she, would like to see you. Dr. Henrene Pastor has approved will you accept this patient?

## 2020-02-26 ENCOUNTER — Ambulatory Visit: Payer: PPO | Attending: Internal Medicine

## 2020-02-26 DIAGNOSIS — Z23 Encounter for immunization: Secondary | ICD-10-CM

## 2020-03-07 DIAGNOSIS — J3089 Other allergic rhinitis: Secondary | ICD-10-CM | POA: Diagnosis not present

## 2020-03-07 DIAGNOSIS — J3081 Allergic rhinitis due to animal (cat) (dog) hair and dander: Secondary | ICD-10-CM | POA: Diagnosis not present

## 2020-03-07 DIAGNOSIS — J301 Allergic rhinitis due to pollen: Secondary | ICD-10-CM | POA: Diagnosis not present

## 2020-03-10 DIAGNOSIS — J301 Allergic rhinitis due to pollen: Secondary | ICD-10-CM | POA: Diagnosis not present

## 2020-03-10 DIAGNOSIS — J3089 Other allergic rhinitis: Secondary | ICD-10-CM | POA: Diagnosis not present

## 2020-03-10 DIAGNOSIS — J3081 Allergic rhinitis due to animal (cat) (dog) hair and dander: Secondary | ICD-10-CM | POA: Diagnosis not present

## 2020-03-21 ENCOUNTER — Ambulatory Visit: Payer: PPO | Attending: Internal Medicine

## 2020-03-21 DIAGNOSIS — Z23 Encounter for immunization: Secondary | ICD-10-CM

## 2020-03-21 NOTE — Progress Notes (Signed)
   Covid-19 Vaccination Clinic  Name:  ANGELESE PIETILA    MRN: [id number] DOB: [birth year]  03/21/2020  Ms. Hamed was observed post Covid-19 immunization for 15 minutes without incident. She was provided with Vaccine Information Sheet and instruction to access the V-Safe system.   Ms. Coone was instructed to call 911 with any severe reactions post vaccine: Marland Kitchen Difficulty breathing  . Swelling of face and throat  . A fast heartbeat  . A bad rash all over body  . Dizziness and weakness   Immunizations Administered    Name Date Dose VIS Date Route   Pfizer COVID-19 Vaccine 03/21/2020 11:12 AM 0.3 mL 12/30/2018 Intramuscular   Manufacturer: [place]   Lot: [id number]   [place]: [id number]

## 2020-03-22 ENCOUNTER — Encounter: Payer: Self-pay | Admitting: Gastroenterology

## 2020-03-22 ENCOUNTER — Other Ambulatory Visit: Payer: Self-pay

## 2020-03-22 ENCOUNTER — Ambulatory Visit: Payer: PPO | Admitting: Gastroenterology

## 2020-03-22 VITALS — BP 126/90 | HR 73 | Ht 61.0 in | Wt 176.0 lb

## 2020-03-22 DIAGNOSIS — Z86010 Personal history of colonic polyps: Secondary | ICD-10-CM

## 2020-03-22 DIAGNOSIS — R140 Abdominal distension (gaseous): Secondary | ICD-10-CM | POA: Diagnosis not present

## 2020-03-22 MED ORDER — NA SULFATE-K SULFATE-MG SULF 17.5-3.13-1.6 GM/177ML PO SOLN: 1.0000 | Freq: Once | ORAL | 0 refills | Status: AC

## 2020-03-22 MED ORDER — DICYCLOMINE HCL 10 MG PO CAPS: 10.0000 mg | ORAL_CAPSULE | Freq: Four times a day (QID) | ORAL | 0 refills | Status: DC

## 2020-03-22 NOTE — Patient Instructions (Addendum)
It was a pleasure to meet you today.  We will work together to evaluate your bloating and we improve your symptoms.  I have given you a brochure today about a low FODMAP diet.  These foods may minimize the production of gas and associated symptoms.  I recommended a trial of dicyclomine 20 mg taken prior to meals to see if this may control some of her bloating.  I recommended an upper endoscopy with gastric and duodenal biopsies to further evaluate her symptoms.  We will perform this at the time of your surveillance colonoscopy.  You had 3 precancerous polyps on your colonoscopy with Dr. Benson Norway in 2016.  He recommends another colonoscopy in 3 years.  I am also recommending a breath test to evaluate for bacterial overgrowth.  We still do not have an answer to your bloating and your symptoms persist we will plan a CT of the abdomen and pelvis for further evaluation.  Thank you for putting your trust in me today.  Please call with any questions or concerns prior to your upcoming endoscopic evaluation.  You have been given a testing kit to check for small intestine bacterial overgrowth (SIBO) which is completed by a company named [employer]. Make sure to return your test in the mail using the return mailing label given you along with the kit. Your demographic and insurance information have already been sent to the company and they should be in contact with you over the next week regarding this test. Please keep in mind that you will be getting a call from phone number [phone] or a similar number. If you do not hear from them within this time frame, please call our office at [phone].

## 2020-03-22 NOTE — Progress Notes (Signed)
Referring Provider: Denita Lung, MD Primary Care Physician:  Denita Lung, MD  Reason for Consultation: "Severe bloating"   IMPRESSION:  Bloating x 20 years History of colon polyps    - 3 tubular adenomas on colonoscopy with Dr. Benson Norway 2016    - surveillance colonoscopy recommended in 3 years  Bloating: Differential includes: Gastroparesis, altered fundic accomodation, functional dyspepsia, SIBO, celiac, food intolerance, and IBS.  Must also consider non-GI etiologies.  History of colon polyps: Due surveillance colonoscopy.     PLAN: Obtain previously records from Dr. Benson Norway Trial of dicyclomine 20 mg QID taken prior to meals Low-FODMAP diet EGD with gastric and duodenal biopsies Colonoscopy Lactulose SIBO breath test CT abd/pelvis if the evaluation above is non-diagnostic Follow-up after endoscopy  Please see the "Patient Instructions" section for addition details about the plan.  I spent over 45 minutes, including in-depth chart review,  face-to-face time with the patient, coordinating care, ordering studies and medications as appropriate, and documentation.   HPI: Maureen Love is a 69 y.o. female self referred for bloating.  History is obtained from the patient and review of her electronic health record.  She also brings a copy of her prior colonoscopy report from a procedure with Dr. Benson Norway in 2016. Previously known to Dr. Henrene Pastor in our practice for a normal screening colonoscopy in 2007.  She has migraine headaches, anxiety, arthritis, depression, hypercholesterolemia, hypertension, asthma, and allergies. She has completed the Covid vaccine series. She works as a Programmer, applications.   Severe bloating and distension x20 years.  Happens within 30 minutes of eating with almost all meals. Will last hours.  Feels it affects her breathing and effects her overall energy levels. Avoid salads and pasta because she notes worse symptoms. No other identified triggers.  No constipation,  diarrhea, or change in her bowel habits.  Eats two meals a day. She feels like she should weigh less because of how little she eats.  Doesn't want to take medications but likes to use supplements as she feels they may be safer.  Has some GERD associated with alcohol (tequila) but doesn't think that's related She doesn't want to mask the problem but wants to know what is wrong.  No improvement with GasEx, Beano, and a medication that starts with a P. She has also tried numerous probiotics.   She is concerned that she has a chemical imbalance. She is concerned that her pH levels are off.   She has been using Juice Plus over the last couple of months but hasn't noticed a difference.   Normal screening colonoscopy with Dr. Henrene Pastor in 2007. Three tubular adenomas and pancolonic diverticulosis on colonoscopy with Dr. Benson Norway 2016. No prior upper endoscopy, although she discussed bloating with Dr. Benson Norway.  No prior abdominal imaging.   Labs from 11/19/19: normal CMP except for chloride 109, crt 1.06. Normal CBC. ESR 2.   Sister with colon polyps in her 39s. No other known family history of colon cancer or polyps. No family history of uterine/endometrial cancer, pancreatic cancer or gastric/stomach cancer.   Past Medical History:  Diagnosis Date  . Anxiety   . Arthritis   . Asthma   . Asthma   . Chronic headaches   . Depression   . Eczema   . Headache   . HTN (hypertension)   . Hyperlipidemia   . Hypertension    no meds at the moment  . Hypothyroidism   . Mild asthma    uses dulera  .  OA (osteoarthritis)   . OAB (overactive bladder)   . Urge urinary incontinence     Past Surgical History:  Procedure Laterality Date  . ABDOMINOPLASTY    . BLADDER SUSPENSION  12 years ago  . CARDIOVASCULAR STRESS TEST  12-23-2007   NORMAL NUCLEAR STUDY  . CYSTOSCOPY WITH INJECTION N/A 06/25/2013   Procedure: CYSTOSCOPY WITH BOTOX INJECTION;  Surgeon: Reece Packer, MD;  Location: [facility];  Service: Urology;  Laterality: N/A;  . CYSTOSCOPY WITH INJECTION N/A 12/14/2014   Procedure: CYSTOSCOPY WITH BOTOX  INJECTION;  Surgeon: Reece Packer, MD;  Location: [facility];  Service: Urology;  Laterality: N/A;  . EXCISION LIPOMAS OF BACK AND RIGHT THIGH  11-28-2011  . GYNECOLOGIC CRYOSURGERY  Age 40-25  . NASAL SEPTUM SURGERY  2012  . NASAL SINUS SURGERY  1990'S  . TUBAL LIGATION  age 53    Current Outpatient Medications  Medication Sig Dispense Refill  . albuterol (PROVENTIL HFA;VENTOLIN HFA) 108 (90 BASE) MCG/ACT inhaler Inhale 2 puffs into the lungs every 6 (six) hours as needed for wheezing or shortness of breath. 1 Inhaler 1  . budesonide-formoterol (SYMBICORT) 160-4.5 MCG/ACT inhaler Inhale 2 puffs into the lungs 2 (two) times daily.    . Glucosamine-Chondroit-Vit C-Mn (GLUCOSAMINE 1500 COMPLEX) CAPS Take by mouth.    . Omega-3 Fatty Acids (FISH OIL) 1200 MG CAPS Take 1,200 mg by mouth daily.     . SUMAtriptan (IMITREX) 100 MG tablet Take 1 tablet (100 mg total) by mouth 2 (two) times daily as needed for up to 1 dose for migraine. 10 tablet 2  . TURMERIC PO Take by mouth. Takes 2,000 daily    . buPROPion (WELLBUTRIN XL) 300 MG 24 hr tablet Take 300 mg by mouth daily.    . famotidine (PEPCID) 20 MG tablet Take 20 mg by mouth daily.    Marland Kitchen oxybutynin (DITROPAN-XL) 10 MG 24 hr tablet TAKE 1 TABLET BY MOUTH AT BEDTIME (Patient not taking: Reported on 06/01/2019) 90 tablet 1   No current facility-administered medications for this visit.    Allergies as of 03/22/2020 - Review Complete 01/07/2020  Allergen Reaction Noted  . Penicillins Swelling 10[birth year]/1954  . Morphine and related Itching 06/17/2013    Family History  Problem Relation Age of Onset  . Hypertension Mother   . Cancer Mother   . Stroke Mother   . Migraines Mother   . Hypertension Father   . Heart attack Father   . Stroke Father   . Thyroid disease Sister   . Migraines Sister     . Heart attack Son   . Migraines Brother   . Stroke Maternal Aunt   . Stroke Maternal Uncle   . Stroke Paternal Aunt   . Stroke Paternal Uncle   . Breast cancer Neg Hx     Social History   Socioeconomic History  . Marital status: Married    Spouse name: Simona Huh  . Number of children: 1  . Years of education: 86  . Highest education level: Not on file  Occupational History    Comment: etired  Tobacco Use  . Smoking status: Never Smoker  . Smokeless tobacco: Never Used  . Tobacco comment: 3/4/1 ocassionally  Substance and Sexual Activity  . Alcohol use: Yes    Comment: OCCASIONAL  . Drug use: No  . Sexual activity: Not Currently  Other Topics Concern  . Not on file  Social History Narrative   Lives with  spouse   Caffeine- daily 1 Dr Malachi Bonds, coffee 1 c   Social Determinants of Health   Financial Resource Strain:   . Difficulty of Paying Living Expenses:   Food Insecurity:   . Worried About Charity fundraiser in the Last Year:   . Arboriculturist in the Last Year:   Transportation Needs:   . Film/video editor (Medical):   Marland Kitchen Lack of Transportation (Non-Medical):   Physical Activity:   . Days of Exercise per Week:   . Minutes of Exercise per Session:   Stress:   . Feeling of Stress :   Social Connections:   . Frequency of Communication with Friends and Family:   . Frequency of Social Gatherings with Friends and Family:   . Attends Religious Services:   . Active Member of Clubs or Organizations:   . Attends Archivist Meetings:   Marland Kitchen Marital Status:   Intimate Partner Violence:   . Fear of Current or Ex-Partner:   . Emotionally Abused:   Marland Kitchen Physically Abused:   . Sexually Abused:     Review of Systems: 12 system ROS is negative except as noted above with anxiety, arthritis, depression, and shortness of breath.   Physical Exam: General:   Alert,  well-nourished, pleasant and cooperative in NAD Head:  Normocephalic and atraumatic. Eyes:   Sclera clear, no icterus.   Conjunctiva pink. Ears:  Normal auditory acuity. Nose:  No deformity, discharge,  or lesions. Mouth:  No deformity or lesions.   Neck:  Supple; no masses or thyromegaly. Lungs:  Clear throughout to auscultation.   No wheezes. Heart:  Regular rate and rhythm; no murmurs. Abdomen:  Soft, mild central obesity, nontender, no tympany, nondistended, normal bowel sounds, no rebound or guarding. No hepatosplenomegaly.   Rectal:  Deferred  Msk:  Symmetrical. No boney deformities LAD: No inguinal or umbilical LAD Extremities:  No clubbing or edema. Neurologic:  Alert and  oriented x4;  grossly nonfocal Skin:  Intact without significant lesions or rashes. Psych:  Alert and cooperative. Normal mood and affect.     Demetria Lightsey L. Tarri Glenn, MD, MPH 03/22/2020, 9:37 AM

## 2020-04-07 DIAGNOSIS — J3081 Allergic rhinitis due to animal (cat) (dog) hair and dander: Secondary | ICD-10-CM | POA: Diagnosis not present

## 2020-04-07 DIAGNOSIS — J3089 Other allergic rhinitis: Secondary | ICD-10-CM | POA: Diagnosis not present

## 2020-04-07 DIAGNOSIS — J301 Allergic rhinitis due to pollen: Secondary | ICD-10-CM | POA: Diagnosis not present

## 2020-04-08 ENCOUNTER — Other Ambulatory Visit: Payer: Self-pay | Admitting: Gastroenterology

## 2020-04-11 DIAGNOSIS — J3089 Other allergic rhinitis: Secondary | ICD-10-CM | POA: Diagnosis not present

## 2020-04-11 DIAGNOSIS — J301 Allergic rhinitis due to pollen: Secondary | ICD-10-CM | POA: Diagnosis not present

## 2020-04-11 DIAGNOSIS — J3081 Allergic rhinitis due to animal (cat) (dog) hair and dander: Secondary | ICD-10-CM | POA: Diagnosis not present

## 2020-04-14 DIAGNOSIS — J3089 Other allergic rhinitis: Secondary | ICD-10-CM | POA: Diagnosis not present

## 2020-04-14 DIAGNOSIS — J301 Allergic rhinitis due to pollen: Secondary | ICD-10-CM | POA: Diagnosis not present

## 2020-04-14 DIAGNOSIS — J3081 Allergic rhinitis due to animal (cat) (dog) hair and dander: Secondary | ICD-10-CM | POA: Diagnosis not present

## 2020-04-25 DIAGNOSIS — J301 Allergic rhinitis due to pollen: Secondary | ICD-10-CM | POA: Diagnosis not present

## 2020-04-25 DIAGNOSIS — J3089 Other allergic rhinitis: Secondary | ICD-10-CM | POA: Diagnosis not present

## 2020-04-25 DIAGNOSIS — J3081 Allergic rhinitis due to animal (cat) (dog) hair and dander: Secondary | ICD-10-CM | POA: Diagnosis not present

## 2020-04-27 ENCOUNTER — Encounter: Payer: Self-pay | Admitting: Gastroenterology

## 2020-04-27 ENCOUNTER — Ambulatory Visit (AMBULATORY_SURGERY_CENTER): Payer: PPO | Admitting: Gastroenterology

## 2020-04-27 ENCOUNTER — Other Ambulatory Visit: Payer: Self-pay

## 2020-04-27 VITALS — BP 148/81 | HR 73 | Temp 97.1°F | Resp 15 | Ht 61.0 in | Wt 176.0 lb

## 2020-04-27 DIAGNOSIS — B9681 Helicobacter pylori [H. pylori] as the cause of diseases classified elsewhere: Secondary | ICD-10-CM

## 2020-04-27 DIAGNOSIS — Z86010 Personal history of colonic polyps: Secondary | ICD-10-CM | POA: Diagnosis not present

## 2020-04-27 DIAGNOSIS — K259 Gastric ulcer, unspecified as acute or chronic, without hemorrhage or perforation: Secondary | ICD-10-CM | POA: Diagnosis not present

## 2020-04-27 DIAGNOSIS — K2970 Gastritis, unspecified, without bleeding: Secondary | ICD-10-CM

## 2020-04-27 DIAGNOSIS — D124 Benign neoplasm of descending colon: Secondary | ICD-10-CM | POA: Diagnosis not present

## 2020-04-27 DIAGNOSIS — D123 Benign neoplasm of transverse colon: Secondary | ICD-10-CM | POA: Diagnosis not present

## 2020-04-27 DIAGNOSIS — R140 Abdominal distension (gaseous): Secondary | ICD-10-CM | POA: Diagnosis not present

## 2020-04-27 DIAGNOSIS — K2950 Unspecified chronic gastritis without bleeding: Secondary | ICD-10-CM | POA: Diagnosis not present

## 2020-04-27 DIAGNOSIS — K2980 Duodenitis without bleeding: Secondary | ICD-10-CM

## 2020-04-27 DIAGNOSIS — D122 Benign neoplasm of ascending colon: Secondary | ICD-10-CM | POA: Diagnosis not present

## 2020-04-27 MED ORDER — PANTOPRAZOLE SODIUM 40 MG PO TBEC: 40.0000 mg | DELAYED_RELEASE_TABLET | Freq: Two times a day (BID) | ORAL | 3 refills | Status: DC

## 2020-04-27 MED ORDER — SODIUM CHLORIDE 0.9 % IV SOLN: 500.0000 mL | Freq: Once | INTRAVENOUS | Status: DC

## 2020-04-27 NOTE — Progress Notes (Signed)
Called to room to assist during endoscopic procedure.  Patient ID and intended procedure confirmed with present staff. Received instructions for my participation in the procedure from the performing physician.  

## 2020-04-27 NOTE — Op Note (Addendum)
[place] Patient Name: Maureen Love Procedure Date: 04/27/2020 1:24 PM MRN: [id number] Endoscopist: Thornton Park MD, MD Age: 69 Referring MD:  Date of Birth: 24-Jul-1951 Gender: Female Account #: [id number] Procedure:                Upper GI endoscopy Indications:              Abdominal bloating Medicines:                Monitored Anesthesia Care Procedure:                Pre-Anesthesia Assessment:                           - Prior to the procedure, a History and Physical                            was performed, and patient medications and                            allergies were reviewed. The patient's tolerance of                            previous anesthesia was also reviewed. The risks                            and benefits of the procedure and the sedation                            options and risks were discussed with the patient.                            All questions were answered, and informed consent                            was obtained. Prior Anticoagulants: The patient has                            taken no previous anticoagulant or antiplatelet                            agents. ASA Grade Assessment: II - A patient with                            mild systemic disease. After reviewing the risks                            and benefits, the patient was deemed in                            satisfactory condition to undergo the procedure.                           After obtaining informed consent, the endoscope was  passed under direct vision. Throughout the                            procedure, the patient's blood pressure, pulse, and                            oxygen saturations were monitored continuously. The                            Endoscope was introduced through the mouth, and                            advanced to the third part of duodenum. The upper                            GI endoscopy was accomplished  without difficulty.                            The patient tolerated the procedure well. Scope In: Scope Out: Findings:                 The examined esophagus was normal.                           One non-bleeding cratered gastric ulcer with no                            stigmata of bleeding was found in the gastric                            antrum. The lesion was 8 mm in largest dimension.                            This was biopsied with a cold forceps for                            histology. Estimated blood loss was minimal.                           Multiple small erosions with no bleeding and no                            stigmata of recent bleeding were found in the                            gastric antrum. Biopsies were taken from the                            antrum, body, and fundus with a cold forceps for                            histology. Estimated blood loss was minimal.  Diffuse mildly erythematous mucosa without active                            bleeding and with no stigmata of bleeding was found                            in the duodenal bulb. Biopsies were taken with a                            cold forceps for histology. Estimated blood loss:                            None.                           The cardia and gastric fundus were normal on                            retroflexion. Complications:            No immediate complications. Estimated blood loss:                            Minimal. Estimated Blood Loss:     Estimated blood loss was minimal. Impression:               - Normal esophagus.                           - Non-bleeding gastric ulcer with no stigmata of                            bleeding. Biopsied.                           - Erosive gastropathy with no bleeding and no                            stigmata of recent bleeding. Biopsied.                           - Erythematous duodenopathy. Biopsied. Recommendation:            - Patient has a contact number available for                            emergencies. The signs and symptoms of potential                            delayed complications were discussed with the                            patient. Return to normal activities tomorrow.                            Written discharge instructions were provided to the  patient.                           - Resume previous diet.                           - Continue present medications.                           - Pantoprazole 40 mg BID x 12 weeks.                           - No aspirin, ibuprofen, naproxen, or other                            non-steroidal anti-inflammatory drugs.                           - Await pathology results.                           - Repeat upper endoscopy in 3 months to evaluate                            the response to therapy. Thornton Park MD, MD 04/27/2020 2:27:56 PM This report has been signed electronically.

## 2020-04-27 NOTE — Op Note (Signed)
[place] Patient Name: Maureen Love Procedure Date: 04/27/2020 1:23 PM MRN: [id number] Endoscopist: Thornton Park MD, MD Age: 69 Referring MD:  Date of Birth: [birth year] Gender: Female Account #: [id number] Procedure:                Colonoscopy Indications:              Surveillance: Personal history of adenomatous                            polyps on last colonoscopy 5 years ago                           History of colon polyps                           - 3 tubular adenomas on colonoscopy with Dr. Benson [place]                            2016                           - surveillance colonoscopy recommended in 3 years Medicines:                Monitored Anesthesia Care Procedure:                Pre-Anesthesia Assessment:                           - Prior to the procedure, a History and Physical                            was performed, and patient medications and                            allergies were reviewed. The patient's tolerance of                            previous anesthesia was also reviewed. The risks                            and benefits of the procedure and the sedation                            options and risks were discussed with the patient.                            All questions were answered, and informed consent                            was obtained. Prior Anticoagulants: The patient has                            taken no previous anticoagulant or antiplatelet  agents. ASA Grade Assessment: II - A patient with                            mild systemic disease. After reviewing the risks                            and benefits, the patient was deemed in                            satisfactory condition to undergo the procedure.                           After obtaining informed consent, the colonoscope                            was passed under direct vision. Throughout the                            procedure, the  patient's blood pressure, pulse, and                            oxygen saturations were monitored continuously. The                            Colonoscope was introduced through the anus and                            advanced to the 3 cm into the ileum. The                            colonoscopy was performed without difficulty. The                            patient tolerated the procedure well. The quality                            of the bowel preparation was excellent. The                            terminal ileum, ileocecal valve, appendiceal                            orifice, and rectum were photographed. Scope In: 1:58:17 PM Scope Out: 2:17:07 PM Scope Withdrawal Time: 0 hours 16 minutes 32 seconds  Total Procedure Duration: 0 hours 18 minutes 50 seconds  Findings:                 The perianal and digital rectal examinations were                            normal.                           A 2 mm polyp was found in the descending colon. The  polyp was sessile. The polyp was removed with a                            cold snare. Resection and retrieval were complete.                            Estimated blood loss was minimal.                           A 2 mm polyp was found in the transverse colon. The                            polyp was sessile. The polyp was removed with a                            cold snare. Resection and retrieval were complete.                            Estimated blood loss was minimal.                           A 1 mm polyp was found in the ascending colon. The                            polyp was flat. The polyp was removed with a cold                            biopsy forceps. Resection and retrieval were                            complete. Estimated blood loss was minimal.                           Multiple small and large-mouthed diverticula were                            found in the left colon.                            The exam was otherwise without abnormality on                            direct and retroflexion views. Complications:            No immediate complications. Estimated blood loss:                            Minimal. Estimated Blood Loss:     Estimated blood loss was minimal. Impression:               - One 2 mm polyp in the descending colon, removed                            with a cold snare. Resected and retrieved.                           -  One 2 mm polyp in the transverse colon, removed                            with a cold snare. Resected and retrieved.                           - One 1 mm polyp in the ascending colon, removed                            with a cold biopsy forceps. Resected and retrieved.                           - Diverticulosis in the left colon.                           - The examination was otherwise normal on direct                            and retroflexion views. Recommendation:           - Patient has a contact number available for                            emergencies. The signs and symptoms of potential                            delayed complications were discussed with the                            patient. Return to normal activities tomorrow.                            Written discharge instructions were provided to the                            patient.                           - Follow a high fiber diet. Drink at least 64                            ounces of water daily. Add a daily stool bulking                            agent such as psyllium (an exampled would be                            Metamucil).                           - Continue present medications.                           - Await pathology results.                           -  Repeat colonoscopy date to be determined after                            pending pathology results are reviewed for                            surveillance.                           - Emerging  evidence supports eating a diet of                            fruits, vegetables, grains, calcium, and yogurt                            while reducing red meat and alcohol may reduce the                            risk of colon cancer.                           - Thank you for allowing me to be involved in your                            colon cancer prevention. Thornton Park MD, MD 04/27/2020 2:34:20 PM This report has been signed electronically.

## 2020-04-27 NOTE — Progress Notes (Signed)
Pt's states no medical or surgical changes since previsit or office visit. 

## 2020-04-27 NOTE — Patient Instructions (Signed)
Information on peptic ulcers, diverticulosis and polyps given to you today.  Await pathology results.  Avoid NSAIDS (Aspirin, Ibuprofen, Aleve, Naproxen), you may use Tylenol as needed.  Repeat upper endoscopy in 3 months to evaluate response to therapy.  Pantoprazole 40 mg twice a day for 12 weeks.  Eat a high fiber diet and drink at least 64 oz of water each day.  YOU HAD AN ENDOSCOPIC PROCEDURE TODAY AT [facility]:   Refer to the procedure report that was given to you for any specific questions about what was found during the examination.  If the procedure report does not answer your questions, please call your gastroenterologist to clarify.  If you requested that your care partner not be given the details of your procedure findings, then the procedure report has been included in a sealed envelope for you to review at your convenience later.  YOU SHOULD EXPECT: Some feelings of bloating in the abdomen. Passage of more gas than usual.  Walking can help get rid of the air that was put into your GI tract during the procedure and reduce the bloating. If you had a lower endoscopy (such as a colonoscopy or flexible sigmoidoscopy) you may notice spotting of blood in your stool or on the toilet paper. If you underwent a bowel prep for your procedure, you may not have a normal bowel movement for a few days.  Please Note:  You might notice some irritation and congestion in your nose or some drainage.  This is from the oxygen used during your procedure.  There is no need for concern and it should clear up in a day or so.  SYMPTOMS TO REPORT IMMEDIATELY:   Following lower endoscopy (colonoscopy or flexible sigmoidoscopy):  Excessive amounts of blood in the stool  Significant tenderness or worsening of abdominal pains  Swelling of the abdomen that is new, acute  Fever of 100F or higher   Following upper endoscopy (EGD)  Vomiting of blood or coffee ground material  New chest  pain or pain under the shoulder blades  Painful or persistently difficult swallowing  New shortness of breath  Fever of 100F or higher  Black, tarry-looking stools  For urgent or emergent issues, a gastroenterologist can be reached at any hour by calling [phone]. Do not use MyChart messaging for urgent concerns.    DIET:  We do recommend a small meal at first, but then you may proceed to your regular diet.  Drink plenty of fluids but you should avoid alcoholic beverages for 24 hours.  ACTIVITY:  You should plan to take it easy for the rest of today and you should NOT DRIVE or use heavy machinery until tomorrow (because of the sedation medicines used during the test).    FOLLOW UP: Our staff will call the number listed on your records 48-72 hours following your procedure to check on you and address any questions or concerns that you may have regarding the information given to you following your procedure. If we do not reach you, we will leave a message.  We will attempt to reach you two times.  During this call, we will ask if you have developed any symptoms of COVID 19. If you develop any symptoms (ie: fever, flu-like symptoms, shortness of breath, cough etc.) before then, please call [phone].  If you test positive for Covid 19 in the 2 weeks post procedure, please call and report this information to [place].    If any biopsies were  taken you will be contacted by phone or by letter within the next 1-3 weeks.  Please call us at [phone] if you have not heard about the biopsies in 3 weeks.    SIGNATURES/CONFIDENTIALITY: You and/or your care partner have signed paperwork which will be entered into your electronic medical record.  These signatures attest to the fact that that the information above on your After Visit Summary has been reviewed and is understood.  Full responsibility of the confidentiality of this discharge information lies with you and/or your care-partner.

## 2020-04-27 NOTE — Progress Notes (Signed)
A/ox3, pleased with MAC, report to RN 

## 2020-04-28 DIAGNOSIS — J3081 Allergic rhinitis due to animal (cat) (dog) hair and dander: Secondary | ICD-10-CM | POA: Diagnosis not present

## 2020-04-28 DIAGNOSIS — J301 Allergic rhinitis due to pollen: Secondary | ICD-10-CM | POA: Diagnosis not present

## 2020-04-28 DIAGNOSIS — J3089 Other allergic rhinitis: Secondary | ICD-10-CM | POA: Diagnosis not present

## 2020-04-29 ENCOUNTER — Telehealth: Payer: Self-pay | Admitting: *Deleted

## 2020-04-29 ENCOUNTER — Telehealth: Payer: Self-pay

## 2020-04-29 NOTE — Telephone Encounter (Signed)
Attempted to reach pt. With follow-up call following endoscopic procedure 04/27/2020.  LM on pt. Voice mail to call if she has any questions or concerns.

## 2020-04-29 NOTE — Telephone Encounter (Signed)
Follow up call made, no answer.

## 2020-05-02 ENCOUNTER — Telehealth: Payer: Self-pay | Admitting: Gastroenterology

## 2020-05-02 NOTE — Telephone Encounter (Signed)
Let pt know that Dr. Tarri Glenn is out of the office and will return to the hospital on Wed, let her know I would call her back then. Pt wants to know if she needs to do the test for SIBO since on the EGD she was found to have an ulcer and H pylori. Please advise.

## 2020-05-03 DIAGNOSIS — J301 Allergic rhinitis due to pollen: Secondary | ICD-10-CM | POA: Diagnosis not present

## 2020-05-03 DIAGNOSIS — J3089 Other allergic rhinitis: Secondary | ICD-10-CM | POA: Diagnosis not present

## 2020-05-03 DIAGNOSIS — J3081 Allergic rhinitis due to animal (cat) (dog) hair and dander: Secondary | ICD-10-CM | POA: Diagnosis not present

## 2020-05-04 NOTE — Telephone Encounter (Signed)
I recommend that we defer the SIBO testing at this time. Hopefully she will feel better with treatment of the H pylori. Thank you.

## 2020-05-04 NOTE — Telephone Encounter (Signed)
Left message for pt to call back  °

## 2020-05-06 NOTE — Telephone Encounter (Signed)
Left message for pt that she does not need to do the test at this time.

## 2020-05-10 ENCOUNTER — Other Ambulatory Visit: Payer: Self-pay

## 2020-05-10 MED ORDER — TETRACYCLINE HCL 500 MG PO CAPS: 500.0000 mg | ORAL_CAPSULE | Freq: Four times a day (QID) | ORAL | 0 refills | Status: DC

## 2020-05-10 MED ORDER — BISMUTH 262 MG PO CHEW: 262.0000 mg | CHEWABLE_TABLET | Freq: Four times a day (QID) | ORAL | 0 refills | Status: DC

## 2020-05-10 MED ORDER — METRONIDAZOLE 500 MG PO TABS: 500.0000 mg | ORAL_TABLET | Freq: Four times a day (QID) | ORAL | 0 refills | Status: DC

## 2020-05-11 ENCOUNTER — Other Ambulatory Visit: Payer: Self-pay

## 2020-05-11 MED ORDER — CLARITHROMYCIN 500 MG PO TABS: 500.0000 mg | ORAL_TABLET | Freq: Two times a day (BID) | ORAL | 0 refills | Status: AC

## 2020-05-12 DIAGNOSIS — J301 Allergic rhinitis due to pollen: Secondary | ICD-10-CM | POA: Diagnosis not present

## 2020-05-12 DIAGNOSIS — J3089 Other allergic rhinitis: Secondary | ICD-10-CM | POA: Diagnosis not present

## 2020-05-12 DIAGNOSIS — J3081 Allergic rhinitis due to animal (cat) (dog) hair and dander: Secondary | ICD-10-CM | POA: Diagnosis not present

## 2020-06-06 DIAGNOSIS — J3089 Other allergic rhinitis: Secondary | ICD-10-CM | POA: Diagnosis not present

## 2020-06-06 DIAGNOSIS — J3081 Allergic rhinitis due to animal (cat) (dog) hair and dander: Secondary | ICD-10-CM | POA: Diagnosis not present

## 2020-06-06 DIAGNOSIS — J301 Allergic rhinitis due to pollen: Secondary | ICD-10-CM | POA: Diagnosis not present

## 2020-06-09 ENCOUNTER — Other Ambulatory Visit: Payer: Self-pay

## 2020-06-09 ENCOUNTER — Ambulatory Visit: Payer: PPO | Admitting: Podiatry

## 2020-06-09 ENCOUNTER — Ambulatory Visit (INDEPENDENT_AMBULATORY_CARE_PROVIDER_SITE_OTHER): Payer: PPO

## 2020-06-09 ENCOUNTER — Encounter: Payer: Self-pay | Admitting: Podiatry

## 2020-06-09 VITALS — Temp 97.7°F

## 2020-06-09 DIAGNOSIS — M79674 Pain in right toe(s): Secondary | ICD-10-CM | POA: Diagnosis not present

## 2020-06-09 DIAGNOSIS — M2041 Other hammer toe(s) (acquired), right foot: Secondary | ICD-10-CM | POA: Diagnosis not present

## 2020-06-09 DIAGNOSIS — M2031 Hallux varus (acquired), right foot: Secondary | ICD-10-CM | POA: Diagnosis not present

## 2020-06-09 DIAGNOSIS — M79675 Pain in left toe(s): Secondary | ICD-10-CM | POA: Diagnosis not present

## 2020-06-10 ENCOUNTER — Other Ambulatory Visit: Payer: Self-pay | Admitting: Podiatry

## 2020-06-10 DIAGNOSIS — M2031 Hallux varus (acquired), right foot: Secondary | ICD-10-CM

## 2020-06-10 DIAGNOSIS — M2041 Other hammer toe(s) (acquired), right foot: Secondary | ICD-10-CM

## 2020-06-13 NOTE — Progress Notes (Signed)
Subjective:   Patient ID: Maureen Love, female   DOB: 69 y.o.   MRN: [id number]   HPI Patient presents stating that she has spreading of her toes on her right foot and she is concerned about this and whether will get worse.  Has multiple questions concerning her feet and she is also getting cramping a secondary problem.  Patient does not smoke   ROS      Objective:  Physical Exam  Neurovascular status intact negative Bevelyn Buckles' sign was noted with patient's right foot showing spreading of the digits with previous surgery with varus deformity of the first metatarsal of a mild nature.  No range of motion loss was noted moderate depression of the arch and cramping which occurs after extended activity     Assessment:  Several different problems with 1 being mild hallux varus deformity creating some type of irritation component and also cramping but no indications of severe disease currently     Plan:  H&P reviewed condition and considerations for different treatment options.  At this point we will try tonic water and soaks for cramping along with supportive shoes I do not recommend treating digits unless they were to worsen  X-rays indicate mild hallux varus deformity right with fixation in place good correction of underlying structural deformity

## 2020-07-04 ENCOUNTER — Other Ambulatory Visit: Payer: Self-pay | Admitting: Gastroenterology

## 2020-07-07 ENCOUNTER — Other Ambulatory Visit: Payer: Self-pay | Admitting: Gastroenterology

## 2020-07-07 DIAGNOSIS — J3081 Allergic rhinitis due to animal (cat) (dog) hair and dander: Secondary | ICD-10-CM | POA: Diagnosis not present

## 2020-07-07 DIAGNOSIS — J301 Allergic rhinitis due to pollen: Secondary | ICD-10-CM | POA: Diagnosis not present

## 2020-07-07 DIAGNOSIS — J3089 Other allergic rhinitis: Secondary | ICD-10-CM | POA: Diagnosis not present

## 2020-08-08 DIAGNOSIS — J301 Allergic rhinitis due to pollen: Secondary | ICD-10-CM | POA: Diagnosis not present

## 2020-08-08 DIAGNOSIS — J3081 Allergic rhinitis due to animal (cat) (dog) hair and dander: Secondary | ICD-10-CM | POA: Diagnosis not present

## 2020-08-08 DIAGNOSIS — J3089 Other allergic rhinitis: Secondary | ICD-10-CM | POA: Diagnosis not present

## 2020-08-10 ENCOUNTER — Ambulatory Visit: Payer: PPO | Admitting: Gastroenterology

## 2020-08-10 ENCOUNTER — Encounter: Payer: Self-pay | Admitting: Gastroenterology

## 2020-08-10 VITALS — BP 134/88 | HR 90 | Ht 61.0 in | Wt 179.8 lb

## 2020-08-10 DIAGNOSIS — K2980 Duodenitis without bleeding: Secondary | ICD-10-CM | POA: Diagnosis not present

## 2020-08-10 DIAGNOSIS — K259 Gastric ulcer, unspecified as acute or chronic, without hemorrhage or perforation: Secondary | ICD-10-CM | POA: Diagnosis not present

## 2020-08-10 DIAGNOSIS — K279 Peptic ulcer, site unspecified, unspecified as acute or chronic, without hemorrhage or perforation: Secondary | ICD-10-CM | POA: Diagnosis not present

## 2020-08-10 DIAGNOSIS — B9681 Helicobacter pylori [H. pylori] as the cause of diseases classified elsewhere: Secondary | ICD-10-CM

## 2020-08-10 NOTE — Patient Instructions (Addendum)
I recommend that you continue to use dicyclomine 20 mg four times daily as needed.  Reduce your pantoprazole to 40 mg daily for one week. After one week, you could reduce the dose to every other day. After one week, you could stop all together.  Two weeks after you complete the pantoprazole, please submit the stool study for H pylori testing. This allows [place] to know that the stomach bacteria was successfully treated.   Please let me know if you need anything before your next colonoscopy in 2024.   We are referring you to [facility].  They will contact you to schedule an appointment. If you have not heard from them within 2 weeks please call us and we will follow up on the referral:  [phone].  Please go to the lab in the basement of our building to pick up your collection device for the H. Pylori stool antigen test done you will return in a few weeks. Hit "B" for basement when you get on the elevator.  When the doors open the lab is on your left.  We will call you with the results. Thank you.  Due to recent changes in healthcare laws, you may see the results of your imaging and laboratory studies on MyChart before your provider has had a chance to review them.  We understand that in some cases there may be results that are confusing or concerning to you. Not all laboratory results come back in the same time frame and the provider may be waiting for multiple results in order to interpret others.  Please give [place] 48 hours in order for your provider to thoroughly review all the results before contacting the office for clarification of your results.    Thank you for entrusting me with your care and for choosing [facility], Dr. Thornton Park     If you are age 80 or older, your body mass index should be between 23-30. Your Body mass index is 33.97 kg/m. If this is out of the aforementioned range listed, please consider follow up with your Primary Care Provider.  If you are  age 90 or younger, your body mass index should be between 19-25. Your Body mass index is 33.97 kg/m. If this is out of the aformentioned range listed, please consider follow up with your Primary Care Provider.

## 2020-08-10 NOTE — Progress Notes (Signed)
Referring Provider: Denita Lung, MD Primary Care Physician:  Denita Lung, MD  Reason for Consultation: "Severe bloating"   IMPRESSION:  Bloating x 20 years, improving H pylori gastric ulcer and gastritis on EGD 04/27/20 History of colon polyps    - no polyps on colonoscopy with Dr. Henrene Pastor 2007    - 3 tubular adenomas on colonoscopy with Dr. Benson Norway 2016    - 3 tubular adenomas on colonoscopy 04/27/20    - surveillance colonoscopy recommended in 3 years Family history of colon polyps (sister)  H pylori gastric ulcer and gastritis: Follow-up testing to document irradication due after discontinuation of PPI. She would like to avoid surveillance EGD. Agreed to proceed if symptoms recur.   Bloating: Likely due to H pylori as symptoms have improved. If symptoms recur, will test for SIBO.  History of colon polyps: Due surveillance colonoscopy in 3 years.   Unintentional weight gain: Referral to [facility].     PLAN: - Continue dicyclomine 20 mg QID taken prior to meals - Reduce pantoprazole 40 mg daily, then one week later every other day and then off - H pylori stool antigen in 4-6 weeks - Referral to [facility] - Surveillance colonoscopy in 3 years  HPI: Maureen Love is a 69 y.o. female who returns in follow-up after her endoscopy.  She has migraine headaches, anxiety, arthritis, depression, hypercholesterolemia, hypertension, asthma, and allergies.She works as a Programmer, applications.   On consultation 5/18/21she reported a 20 year history of postprandial bloating and distension. Worsened by salads and pasta. Frustrated by unintentional weight gain. Reported GERD associated with alcohol (tequila) but doesn't think that's related. No improvement with GasEx, Beano, multiple probiotics, JuicePlus or a medication that starts with a P. She has also tried numerous probiotics.    EGD performed 04/27/20 showed H pylori gastritis, 1mm gastric ulcer, normal  duodenal biopsies Colonoscopy performed 04/27/20 showed 3 tubular adenomas.   Symptoms including bloating Improved with treatment of H pylori using bismuth, flagyl, PPI, and clarithromycin. Although she continues to have some bloating with high salt foods. Ongoing unintentional weight gain.   No new GI complaints or concerns.     Normal screening colonoscopy with Dr. Henrene Pastor in 2007. Three tubular adenomas and pancolonic diverticulosis on colonoscopy with Dr. Benson Norway 2016. No prior upper endoscopy, although she discussed bloating with Dr. Benson Norway.  Past Medical History:  Diagnosis Date  . Anxiety   . Arthritis   . Asthma   . Asthma   . Chronic headaches   . Depression   . Eczema   . Headache   . HTN (hypertension)   . Hyperlipidemia   . Hypertension    no meds at the moment  . Hypothyroidism   . Mild asthma    uses dulera  . OA (osteoarthritis)   . OAB (overactive bladder)   . Urge urinary incontinence     Past Surgical History:  Procedure Laterality Date  . ABDOMINOPLASTY    . BLADDER SUSPENSION  12 years ago  . CARDIOVASCULAR STRESS TEST  12-23-2007   NORMAL NUCLEAR STUDY  . CYSTOSCOPY WITH INJECTION N/A 06/25/2013   Procedure: CYSTOSCOPY WITH BOTOX INJECTION;  Surgeon: Reece Packer, MD;  Location: [facility];  Service: Urology;  Laterality: N/A;  . CYSTOSCOPY WITH INJECTION N/A 12/14/2014   Procedure: CYSTOSCOPY WITH BOTOX  INJECTION;  Surgeon: Reece Packer, MD;  Location: [facility];  Service: Urology;  Laterality: N/A;  .  EXCISION LIPOMAS OF BACK AND RIGHT THIGH  11-28-2011  . GYNECOLOGIC CRYOSURGERY  Age 38-25  . NASAL SEPTUM SURGERY  2012  . NASAL SINUS SURGERY  1990'S  . TUBAL LIGATION  age 4    Current Outpatient Medications  Medication Sig Dispense Refill  . albuterol (PROVENTIL HFA;VENTOLIN HFA) 108 (90 BASE) MCG/ACT inhaler Inhale 2 puffs into the lungs every 6 (six) hours as needed for wheezing or shortness of breath.  1 Inhaler 1  . budesonide-formoterol (SYMBICORT) 160-4.5 MCG/ACT inhaler Inhale 2 puffs into the lungs 2 (two) times daily.    Marland Kitchen dicyclomine (BENTYL) 10 MG capsule TAKE 1 CAPSULE (10 MG TOTAL) BY MOUTH 4 (FOUR) TIMES DAILY. PLEASE KEEP YOUR OCTOBER APPOINTMENT FOR FURTHER REFILLS. THANK YOU 360 capsule 1  . EPINEPHrine 0.3 mg/0.3 mL IJ SOAJ injection AS DIRECTED AS NEEDED FOR SYSTEMIC REACTIONS INJECTION 30 DAYS    . Glucosamine-Chondroit-Vit C-Mn (GLUCOSAMINE 1500 COMPLEX) CAPS Take 1 capsule by mouth daily.     . Omega-3 Fatty Acids (FISH OIL) 1200 MG CAPS Take 1,000 mg by mouth daily.     . pantoprazole (PROTONIX) 40 MG tablet Take 1 tablet (40 mg total) by mouth 2 (two) times daily. 90 tablet 3  . TURMERIC PO Take by mouth. Takes 2,000 daily     No current facility-administered medications for this visit.    Allergies as of 08/10/2020 - Review Complete 08/10/2020  Allergen Reaction Noted  . Penicillins Swelling 10/11/1954  . Morphine and related Itching 06/17/2013    Family History  Problem Relation Age of Onset  . Hypertension Mother   . Stroke Mother   . Migraines Mother   . Stomach cancer Mother   . Hypertension Father   . Heart attack Father   . Stroke Father   . Thyroid disease Sister   . Migraines Sister   . Heart attack Son   . Migraines Brother   . Stroke Maternal Aunt   . Stroke Maternal Uncle   . Stroke Paternal Aunt   . Stroke Paternal Uncle   . Breast cancer Neg Hx   . Esophageal cancer Neg Hx   . Liver disease Neg Hx   . Colon cancer Neg Hx   . Pancreatic cancer Neg Hx     Social History   Socioeconomic History  . Marital status: Married    Spouse name: Simona Huh  . Number of children: 1  . Years of education: 37  . Highest education level: Not on file  Occupational History    Comment: etired  Tobacco Use  . Smoking status: Never Smoker  . Smokeless tobacco: Never Used  Vaping Use  . Vaping Use: Never used  Substance and Sexual Activity  .  Alcohol use: Yes    Comment: OCCASIONAL  . Drug use: No  . Sexual activity: Not Currently  Other Topics Concern  . Not on file  Social History Narrative   Lives with spouse   Caffeine- daily 1 Dr Malachi Bonds, coffee 1 c   Social Determinants of Health   Financial Resource Strain:   . Difficulty of Paying Living Expenses: Not on file  Food Insecurity:   . Worried About Charity fundraiser in the Last Year: Not on file  . Ran Out of Food in the Last Year: Not on file  Transportation Needs:   . Lack of Transportation (Medical): Not on file  . Lack of Transportation (Non-Medical): Not on file  Physical Activity:   . Days of Exercise  per Week: Not on file  . Minutes of Exercise per Session: Not on file  Stress:   . Feeling of Stress : Not on file  Social Connections:   . Frequency of Communication with Friends and Family: Not on file  . Frequency of Social Gatherings with Friends and Family: Not on file  . Attends Religious Services: Not on file  . Active Member of Clubs or Organizations: Not on file  . Attends Archivist Meetings: Not on file  . Marital Status: Not on file  Intimate Partner Violence:   . Fear of Current or Ex-Partner: Not on file  . Emotionally Abused: Not on file  . Physically Abused: Not on file  . Sexually Abused: Not on file     Physical Exam: General:   Alert,  well-nourished, pleasant and cooperative in NAD Head:  Normocephalic and atraumatic. Eyes:  Sclera clear, no icterus.   Conjunctiva pink. Abdomen:  Soft, mild central obesity, nontender, no tympany, nondistended, normal bowel sounds, no rebound or guarding. No hepatosplenomegaly.   Neurologic:  Alert and  oriented x4;  grossly nonfocal Skin:  Intact without significant lesions or rashes. Psych:  Alert and cooperative. Normal mood and affect.     Chandria Rookstool L. Tarri Glenn, MD, MPH 08/10/2020, 11:16 AM

## 2020-09-01 ENCOUNTER — Encounter (INDEPENDENT_AMBULATORY_CARE_PROVIDER_SITE_OTHER): Payer: Self-pay | Admitting: Family Medicine

## 2020-09-01 ENCOUNTER — Ambulatory Visit (INDEPENDENT_AMBULATORY_CARE_PROVIDER_SITE_OTHER): Payer: PPO | Admitting: Family Medicine

## 2020-09-01 ENCOUNTER — Other Ambulatory Visit: Payer: Self-pay

## 2020-09-01 VITALS — BP 155/82 | HR 83 | Temp 97.9°F | Ht 60.0 in | Wt 177.0 lb

## 2020-09-01 DIAGNOSIS — E7849 Other hyperlipidemia: Secondary | ICD-10-CM | POA: Diagnosis not present

## 2020-09-01 DIAGNOSIS — R5383 Other fatigue: Secondary | ICD-10-CM | POA: Insufficient documentation

## 2020-09-01 DIAGNOSIS — Z8639 Personal history of other endocrine, nutritional and metabolic disease: Secondary | ICD-10-CM

## 2020-09-01 DIAGNOSIS — J45909 Unspecified asthma, uncomplicated: Secondary | ICD-10-CM

## 2020-09-01 DIAGNOSIS — Z6834 Body mass index (BMI) 34.0-34.9, adult: Secondary | ICD-10-CM

## 2020-09-01 DIAGNOSIS — K219 Gastro-esophageal reflux disease without esophagitis: Secondary | ICD-10-CM | POA: Diagnosis not present

## 2020-09-01 DIAGNOSIS — R0602 Shortness of breath: Secondary | ICD-10-CM | POA: Diagnosis not present

## 2020-09-01 DIAGNOSIS — E669 Obesity, unspecified: Secondary | ICD-10-CM | POA: Diagnosis not present

## 2020-09-01 DIAGNOSIS — E785 Hyperlipidemia, unspecified: Secondary | ICD-10-CM | POA: Diagnosis not present

## 2020-09-01 DIAGNOSIS — F3289 Other specified depressive episodes: Secondary | ICD-10-CM | POA: Diagnosis not present

## 2020-09-01 DIAGNOSIS — F32A Depression, unspecified: Secondary | ICD-10-CM | POA: Diagnosis not present

## 2020-09-01 DIAGNOSIS — G479 Sleep disorder, unspecified: Secondary | ICD-10-CM | POA: Diagnosis not present

## 2020-09-01 DIAGNOSIS — Z0289 Encounter for other administrative examinations: Secondary | ICD-10-CM

## 2020-09-01 DIAGNOSIS — R030 Elevated blood-pressure reading, without diagnosis of hypertension: Secondary | ICD-10-CM | POA: Diagnosis not present

## 2020-09-01 DIAGNOSIS — J4520 Mild intermittent asthma, uncomplicated: Secondary | ICD-10-CM | POA: Diagnosis not present

## 2020-09-02 LAB — CBC WITH DIFFERENTIAL/PLATELET
Basophils Absolute: 0 10*3/uL (ref 0.0–0.2)
Basos: 1 %
EOS (ABSOLUTE): 0.1 10*3/uL (ref 0.0–0.4)
Eos: 1 %
Hematocrit: 38.8 % (ref 34.0–46.6)
Hemoglobin: 13 g/dL (ref 11.1–15.9)
Immature Grans (Abs): 0 10*3/uL (ref 0.0–0.1)
Immature Granulocytes: 0 %
Lymphocytes Absolute: 1.5 10*3/uL (ref 0.7–3.1)
Lymphs: 28 %
MCH: 29.9 pg (ref 26.6–33.0)
MCHC: 33.5 g/dL (ref 31.5–35.7)
MCV: 89 fL (ref 79–97)
Monocytes Absolute: 0.4 10*3/uL (ref 0.1–0.9)
Monocytes: 7 %
Neutrophils Absolute: 3.4 10*3/uL (ref 1.4–7.0)
Neutrophils: 63 %
Platelets: 282 10*3/uL (ref 150–450)
RBC: 4.35 x10E6/uL (ref 3.77–5.28)
RDW: 12.6 % (ref 11.7–15.4)
WBC: 5.5 10*3/uL (ref 3.4–10.8)

## 2020-09-02 LAB — HEMOGLOBIN A1C
Est. average glucose Bld gHb Est-mCnc: 108 mg/dL
Hgb A1c MFr Bld: 5.4 % (ref 4.8–5.6)

## 2020-09-02 LAB — COMPREHENSIVE METABOLIC PANEL
ALT: 18 IU/L (ref 0–32)
AST: 17 IU/L (ref 0–40)
Albumin/Globulin Ratio: 1.7 (ref 1.2–2.2)
Albumin: 4.2 g/dL (ref 3.8–4.8)
Alkaline Phosphatase: 63 IU/L (ref 44–121)
BUN/Creatinine Ratio: 13 (ref 12–28)
BUN: 11 mg/dL (ref 8–27)
Bilirubin Total: 0.4 mg/dL (ref 0.0–1.2)
CO2: 23 mmol/L (ref 20–29)
Calcium: 8.7 mg/dL (ref 8.7–10.3)
Chloride: 105 mmol/L (ref 96–106)
Creatinine, Ser: 0.82 mg/dL (ref 0.57–1.00)
GFR calc Af Amer: 85 mL/min/{1.73_m2} (ref >59)
GFR calc non Af Amer: 74 mL/min/{1.73_m2} (ref >59)
Globulin, Total: 2.5 g/dL (ref 1.5–4.5)
Glucose: 94 mg/dL (ref 65–99)
Potassium: 3.9 mmol/L (ref 3.5–5.2)
Sodium: 140 mmol/L (ref 134–144)
Total Protein: 6.7 g/dL (ref 6.0–8.5)

## 2020-09-02 LAB — TSH: TSH: 3.45 u[IU]/mL (ref 0.450–4.500)

## 2020-09-02 LAB — LIPID PANEL
Chol/HDL Ratio: 10.3 ratio — ABNORMAL HIGH (ref 0.0–4.4)
Cholesterol, Total: 328 mg/dL — ABNORMAL HIGH (ref 100–199)
HDL: 32 mg/dL — ABNORMAL LOW (ref >39)
LDL Chol Calc (NIH): 221 mg/dL — ABNORMAL HIGH (ref 0–99)
Triglycerides: 353 mg/dL — ABNORMAL HIGH (ref 0–149)
VLDL Cholesterol Cal: 75 mg/dL — ABNORMAL HIGH (ref 5–40)

## 2020-09-02 LAB — VITAMIN D 25 HYDROXY (VIT D DEFICIENCY, FRACTURES): Vit D, 25-Hydroxy: 19.7 ng/mL — ABNORMAL LOW (ref 30.0–100.0)

## 2020-09-02 LAB — FOLATE: Folate: >20 ng/mL (ref >3.0)

## 2020-09-02 LAB — T4: T4, Total: 5.7 ug/dL (ref 4.5–12.0)

## 2020-09-02 LAB — VITAMIN B12: Vitamin B-12: 1036 pg/mL (ref 232–1245)

## 2020-09-02 LAB — INSULIN, RANDOM: INSULIN: 6.9 u[IU]/mL (ref 2.6–24.9)

## 2020-09-02 LAB — T3: T3, Total: 79 ng/dL (ref 71–180)

## 2020-09-05 NOTE — Progress Notes (Signed)
Chief Complaint:   OBESITY Maureen Love (MR# [id number]) is a 69 y.o. female who presents for evaluation and treatment of obesity and related comorbidities. Current BMI is Body mass index is 34.57 kg/m. Maureen Love has been struggling with her weight for many years and has been unsuccessful in either losing weight, maintaining weight loss, or reaching her healthy weight goal.  Maureen Love is currently in the action stage of change and ready to dedicate time achieving and maintaining a healthier weight. Maureen Love is interested in becoming our patient and working on intensive lifestyle modifications including (but not limited to) diet and exercise for weight loss.  Maureen Love is retired.  She lives with her husband, Maureen Love.  She says she eats out 6 days per week.  Maureen Love's habits were reviewed today and are as follows: Her family eats meals together, she thinks her family will eat healthier with her, her desired weight loss is 29 pounds, she has been heavy most of her life, she started gaining weight after menopause, her heaviest weight ever was 179 pounds, she craves potatoes and meat, she skips lunch frequently, she is frequently drinking liquids with calories, she frequently makes poor food choices, she has problems with excessive hunger and she struggles with emotional eating.  This is the patient's first visit at [facility].  The patient's NEW PATIENT PACKET that they filled out prior to today's office visit was reviewed at length and information from that paperwork was included within the following office visit note.    Included in the packet: current and past health history, medications, allergies, ROS, gynecologic history (women only), surgical history, family history, social history, weight history, weight loss surgery history (for those that have had weight loss surgery), nutritional evaluation, mood and food questionnaire along with a depression screening (PHQ9) on all patients, an Epworth  questionnaire, sleep habits questionnaire, patient life and health improvement goals questionnaire. These will all be scanned into the patient's chart under media.   During the visit, I independently reviewed the patient's EKG, bioimpedance scale results, and indirect calorimeter results. I used this information to tailor a meal plan for the patient that will help Maureen Love to lose weight and will improve her obesity-related conditions going forward.  I performed a medically necessary appropriate examination and/or evaluation. I discussed the assessment and treatment plan with the patient. The patient was provided an opportunity to ask questions and all were answered. The patient agreed with the plan and demonstrated an understanding of the instructions. Labs were ordered today (unless patient declined them) and will be reviewed with the patient at our next visit unless more critical results need to be addressed immediately. Clinical information was updated and documented in the EMR.  Time spent on visit including pre-visit chart review and post-visit care was estimated to be 60-74 minutes.  A separate 15 minutes was spent on risk counseling (see above/below).    Depression Screen Maureen Love's Food and Mood (modified PHQ-9) score was 15.  Depression screen PHQ 2/9 09/01/2020  Decreased Interest 2  Down, Depressed, Hopeless 1  PHQ - 2 Score 3  Altered sleeping 0  Tired, decreased energy 2  Change in appetite 1  Feeling bad or failure about yourself  3  Trouble concentrating 1  Moving slowly or fidgety/restless 2  Suicidal thoughts 3  PHQ-9 Score 15  Difficult doing work/chores Very difficult     Assessment/Plan:    1. Other fatigue Maureen Love admits to daytime somnolence and denies waking up  still tired. Patent has a history of symptoms of daytime fatigue, morning headache and snoring. Maureen Love generally gets 7 or 8 hours of sleep per night, and states that she has generally restful sleep. Snoring is  present. Apneic episodes are not present. Epworth Sleepiness Score is 12.  Maureen Love does feel that her weight is causing her energy to be lower than it should be. Fatigue may be related to obesity, depression or many other causes. Labs will be ordered, and in the meanwhile, Maureen Love will focus on self care including making healthy food choices, increasing physical activity and focusing on stress reduction.  - EKG 12-Lead - Vitamin B12 - CBC with Differential/Platelet - Comprehensive metabolic panel - Folate - Hemoglobin A1c - Insulin, random - Lipid panel - T3 - T4 - TSH - VITAMIN D 25 Hydroxy (Vit-D Deficiency, Fractures)  2. SOB (shortness of breath) on exertion Maureen Love notes increasing shortness of breath with exercising and seems to be worsening over time with weight gain. She notes getting out of breath sooner with activity than she used to. This has gotten worse recently. Maureen Love denies shortness of breath at rest or orthopnea.  Maureen Love does feel that she gets out of breath more easily that she used to when she exercises. Maureen Love's shortness of breath appears to be obesity related and exercise induced. She has agreed to work on weight loss and gradually increase exercise to treat her exercise induced shortness of breath. Will continue to monitor closely.  - Vitamin B12 - CBC with Differential/Platelet - Comprehensive metabolic panel - Folate - Hemoglobin A1c - Insulin, random - Lipid panel - T3 - T4 - TSH - VITAMIN D 25 Hydroxy (Vit-D Deficiency, Fractures)    3. Asthma, unspecified asthma severity, unspecified whether complicated, unspecified whether persistent Maureen Love is taking Symbicort and albuterol.  Asymptomatic.  No concerns currently, but states her activity can be significantly limited due to shortness of breath at times, especially during allergy season- not currently per pt.  Plan:  Check labs today.   - Follow-up with your pulmonologist and/or PCP regarding enhancing  treatment plan to better control symptoms.   - we will closely monitor condition as patient continues with their health journey as this can lead to significant changes in pt's physical condition with significant weight loss   - Vitamin B12 - CBC with Differential/Platelet - Comprehensive metabolic panel - Folate - Hemoglobin A1c - Insulin, random - Lipid panel - T3 - T4 - TSH - VITAMIN D 25 Hydroxy (Vit-D Deficiency, Fractures)    4. Gastroesophageal reflux disease without esophagitis Maureen Love is taking pantoprazole 40 mg daily for GERD symptoms. No concerns or sx today  Plan:   - Continue medications and treatment plan as currently prescribed by PCP/ specialists.  - we will closely monitor condition - Check labs today.   - Vitamin B12 - CBC with Differential/Platelet - Comprehensive metabolic panel - Folate - Hemoglobin A1c - Insulin, random - Lipid panel - T3 - T4 - TSH - VITAMIN D 25 Hydroxy (Vit-D Deficiency, Fractures)    5. Other hyperlipidemia She is not on any medication.  She says "she will not take medication" because they caused muscle cramps in the past.  Plan:  Check labs today.  Maureen Love and wt loss to minimize CV risk.   Treatment plan per pcp otherwise  Lab Results  Component Value Date   ALT 18 09/01/2020   AST 17 09/01/2020   ALKPHOS 63 09/01/2020   BILITOT 0.4 09/01/2020   Lab  Results  Component Value Date   CHOL 328 (H) 09/01/2020   HDL 32 (L) 09/01/2020   LDLCALC 221 (H) 09/01/2020   TRIG 353 (H) 09/01/2020   CHOLHDL 10.3 (H) 09/01/2020   - Vitamin B12 - CBC with Differential/Platelet - Comprehensive metabolic panel - Folate - Hemoglobin A1c - Insulin, random - Lipid panel - T3 - T4 - TSH - VITAMIN D 25 Hydroxy (Vit-D Deficiency, Fractures)     6. History of Elevated blood pressure reading She is not on blood pressure medication.  Per pt, she tells me her PCP does not think she needs medication.  Blood pressure goes up and  down, has a lot of variability, but never stays up for long per pt.   Asx  Plan:  Treatment plan per PCP.  Goal of 140/90 or less.  Weight loss, low salt.  Check labs.    BP Readings from Last 3 Encounters:  09/01/20 (!) 155/82  08/10/20 134/88  04/27/20 (!) 148/81   - Vitamin B12 - CBC with Differential/Platelet - Comprehensive metabolic panel - Folate - Hemoglobin A1c - Insulin, random - Lipid panel - T3 - T4 - TSH - VITAMIN D 25 Hydroxy (Vit-D Deficiency, Fractures)    7. Sleep disturbances Epworth sleepiness score is 12.    She does not feel she has sleep issues.    She says she "sleeps well".  Plan:  No need for OSA evaluation per pt, doesn't feel a concern about it    8. Other depression with emotional eating With anxiety.  PHQ-9 is 15.  Does not feel depressed at all, she says.  She says she is "too busy to sit around and be depressed!"  Plan:  Will monitor.   Rec pt f/up with PCP as scheduled   - Vitamin B12 - CBC with Differential/Platelet - Comprehensive metabolic panel - Folate - Hemoglobin A1c - Insulin, random - Lipid panel - T3 - T4 - TSH - VITAMIN D 25 Hydroxy (Vit-D Deficiency, Fractures)    9. Class 1 obesity with serious comorbidity and body mass index (BMI) of 34.0 to 34.9 in adult, unspecified obesity type  Maureen Love is currently in the action stage of change and her goal is to continue with weight loss efforts. I recommend Maureen Love begin the structured treatment plan as follows:  She has agreed to the Category 1 Plan.  Exercise goals: As is.   Behavioral modification strategies: decreasing liquid calories, meal planning and cooking strategies, keeping healthy foods in the home, celebration eating (going out, healthy choices), strategies and planning for success.  She was informed of the importance of frequent follow-up visits to maximize her success with intensive lifestyle modifications for her multiple health conditions. She was informed we  would discuss her lab results at her next visit unless there is a critical issue that needs to be addressed sooner. Maureen Love agreed to keep her next visit at the agreed upon time to discuss these results.   Return in about 2 weeks (around 09/15/2020).    Objective:   Blood pressure (!) 155/82, pulse 83, temperature 97.9 F (36.6 C), height 5' (1.524 m), weight 177 lb (80.3 kg), SpO2 98 %. Body mass index is 34.57 kg/m.  EKG: Normal sinus rhythm, rate 75 bpm.  Indirect Calorimeter completed today shows a VO2 of 210 and a REE of 1461.  Her calculated basal metabolic rate is 5366 thus her basal metabolic rate is better than expected.  General: Cooperative, alert, well developed, in no acute  distress. HEENT: Conjunctivae and lids unremarkable. Cardiovascular: Regular rhythm.  Lungs: Normal work of breathing. Neurologic: No focal deficits.   Lab Results  Component Value Date   CREATININE 0.82 09/01/2020   BUN 11 09/01/2020   NA 140 09/01/2020   K 3.9 09/01/2020   CL 105 09/01/2020   CO2 23 09/01/2020   Lab Results  Component Value Date   ALT 18 09/01/2020   AST 17 09/01/2020   ALKPHOS 63 09/01/2020   BILITOT 0.4 09/01/2020   Lab Results  Component Value Date   HGBA1C 5.4 09/01/2020   HGBA1C 5.3 04/17/2017   HGBA1C 5.2 07/21/2014   Lab Results  Component Value Date   INSULIN 6.9 09/01/2020   Lab Results  Component Value Date   TSH 3.450 09/01/2020   Lab Results  Component Value Date   CHOL 328 (H) 09/01/2020   HDL 32 (L) 09/01/2020   LDLCALC 221 (H) 09/01/2020   TRIG 353 (H) 09/01/2020   CHOLHDL 10.3 (H) 09/01/2020   Lab Results  Component Value Date   WBC 5.5 09/01/2020   HGB 13.0 09/01/2020   HCT 38.8 09/01/2020   MCV 89 09/01/2020   PLT 282 09/01/2020   Obesity Behavioral Intervention:   Approximately 15 minutes were spent on the discussion below.  ASK: We discussed the diagnosis of obesity with Maureen Love today and Maureen Love agreed to give [place] permission to  discuss obesity behavioral modification therapy today.  ASSESS: Maureen Love has the diagnosis of obesity and her BMI today is 34.6. Maureen Love is in the action stage of change.   ADVISE: Maureen Love was educated on the multiple health risks of obesity as well as the benefit of weight loss to improve her health. She was advised of the need for long term treatment and the importance of lifestyle modifications to improve her current health and to decrease her risk of future health problems.  AGREE: Multiple dietary modification options and treatment options were discussed and Jilleen agreed to follow the recommendations documented in the above note.  ARRANGE: Maureen Love was educated on the importance of frequent visits to treat obesity as outlined per CMS and USPSTF guidelines and agreed to schedule her next follow up appointment today.  Attestation Statements:   Reviewed by clinician on day of visit: allergies, medications, problem list, medical history, surgical history, family history, social history, and previous encounter notes.  I, Water quality scientist, CMA, am acting as Location manager for [employer], DO.  I have reviewed the above documentation for accuracy and completeness, and I agree with the above. Marjory Sneddon, D.O.  The [place] was signed into law in 2016 which includes the topic of electronic health records.  This provides immediate access to information in MyChart.  This includes consultation notes, operative notes, office notes, lab results and pathology reports.  If you have any questions about what you read please let us know at your next visit so we can discuss your concerns and take corrective action if need be.  We are right here with you.

## 2020-09-06 DIAGNOSIS — J3089 Other allergic rhinitis: Secondary | ICD-10-CM | POA: Diagnosis not present

## 2020-09-06 DIAGNOSIS — J3081 Allergic rhinitis due to animal (cat) (dog) hair and dander: Secondary | ICD-10-CM | POA: Diagnosis not present

## 2020-09-06 DIAGNOSIS — J301 Allergic rhinitis due to pollen: Secondary | ICD-10-CM | POA: Diagnosis not present

## 2020-09-15 ENCOUNTER — Ambulatory Visit (INDEPENDENT_AMBULATORY_CARE_PROVIDER_SITE_OTHER): Payer: PPO | Admitting: Family Medicine

## 2020-09-19 ENCOUNTER — Other Ambulatory Visit: Payer: Self-pay

## 2020-09-19 ENCOUNTER — Encounter (INDEPENDENT_AMBULATORY_CARE_PROVIDER_SITE_OTHER): Payer: Self-pay | Admitting: Family Medicine

## 2020-09-19 ENCOUNTER — Ambulatory Visit (INDEPENDENT_AMBULATORY_CARE_PROVIDER_SITE_OTHER): Payer: PPO | Admitting: Family Medicine

## 2020-09-19 VITALS — BP 142/82 | HR 89 | Temp 97.8°F | Ht 60.0 in | Wt 174.0 lb

## 2020-09-19 DIAGNOSIS — E559 Vitamin D deficiency, unspecified: Secondary | ICD-10-CM | POA: Diagnosis not present

## 2020-09-19 DIAGNOSIS — K2980 Duodenitis without bleeding: Secondary | ICD-10-CM

## 2020-09-19 DIAGNOSIS — E669 Obesity, unspecified: Secondary | ICD-10-CM

## 2020-09-19 DIAGNOSIS — Z6834 Body mass index (BMI) 34.0-34.9, adult: Secondary | ICD-10-CM | POA: Diagnosis not present

## 2020-09-19 DIAGNOSIS — E8881 Metabolic syndrome: Secondary | ICD-10-CM | POA: Diagnosis not present

## 2020-09-19 DIAGNOSIS — E7849 Other hyperlipidemia: Secondary | ICD-10-CM | POA: Diagnosis not present

## 2020-09-19 DIAGNOSIS — K259 Gastric ulcer, unspecified as acute or chronic, without hemorrhage or perforation: Secondary | ICD-10-CM | POA: Diagnosis not present

## 2020-09-19 DIAGNOSIS — J45909 Unspecified asthma, uncomplicated: Secondary | ICD-10-CM

## 2020-09-19 DIAGNOSIS — R030 Elevated blood-pressure reading, without diagnosis of hypertension: Secondary | ICD-10-CM

## 2020-09-19 MED ORDER — ROSUVASTATIN CALCIUM 5 MG PO TABS: ORAL_TABLET | ORAL | 0 refills | Status: DC

## 2020-09-19 MED ORDER — VITAMIN D (ERGOCALCIFEROL) 1.25 MG (50000 UNIT) PO CAPS: 50000.0000 [IU] | ORAL_CAPSULE | ORAL | 0 refills | Status: DC

## 2020-09-20 LAB — HELICOBACTER PYLORI  SPECIAL ANTIGEN
MICRO NUMBER:: 11203690
RESULT:: DETECTED — AB
SPECIMEN QUALITY: ADEQUATE

## 2020-09-22 NOTE — Progress Notes (Signed)
Chief Complaint:   OBESITY Maureen Love is here to discuss her progress with her obesity treatment plan along with follow-up of her obesity related diagnoses. Maureen Love is on the Category 1 Plan and states she is following her eating plan approximately 0% of the time. Maureen Love states she is walking with Youtube videos 15 minutes 3-4 times per week.  Today's visit was #: 2 Starting weight: 177 lbs Starting date: 09/01/2020 Today's weight: 174 lbs Today's date: 09/19/2020 Total lbs lost to date: 3 lbs Total lbs lost since last in-office visit: 3 lbs Total weight loss percentage to date: -1.69%   Interim History:  Maureen Love is here for her first follow-up to discuss meal plan and discuss all labs recently done.   Her first office visit was 09/01/2020. She went to the beach for 5 days and made healthier choices. She has decreased bread, potatoes, etc. She bought protein shakes and bars for snacks. Maureen Love denies hunger and craving concerns.   Plan: We will recheck Vit D, FLP, and ALT in 2 months.   Assessment/Plan:    Meds ordered this encounter  Medications  . Start Vitamin D, Ergocalciferol, (DRISDOL) 1.25 MG (50000 UNIT) CAPS capsule    Sig: Take 1 capsule (50,000 Units total) by mouth every 7 (seven) days.    Dispense:  4 capsule    Refill:  0  . Start rosuvastatin (CRESTOR) 5 MG tablet    Sig: Every other night before bedtime    Dispense:  30 tablet    Refill:  0    1. Elevated blood pressure reading Discussed labs with patient today.   Libni's blood pressure at her last office visit was 155/82 and it has improved today.   She was encouraged to check her blood pressure at home, but she did not.  She is Maureen Love.   Plan:  Maureen Love's blood pressure is essentially at goal for age.  She will decrease her salt intake and weight loss via prudent nutritional plan.  We will continue to monitor blood pressure closely and I rec she check at home 2-3 times / week.    2. Insulin resistance New.    Discussed labs with patient today.   Maureen Love has a diagnosis of insulin resistance based on her elevated fasting insulin level >5. She continues to work on diet and exercise to decrease her risk of diabetes.  Lab Results  Component Value Date   INSULIN 6.9 09/01/2020   Lab Results  Component Value Date   HGBA1C 5.4 09/01/2020    Plan:  - I counseled patient on pathophysiology of the disease process of IR.     - Stressed importance of dietary and lifestyle modifications resulting in weight loss as first line txmnt  - continue to decrease simple carbs; increase fiber and proteins  - Handouts provided at pt's request after education provided and all concerns/questions addressed.    - Anticipatory guidance given.    - Recheck fasting insulin level in approximately 3 months from last check or as deemed fit.     3. Other hyperlipidemia Worsening.  Discussed labs with patient today.   Maureen Love was told she had HLD in the past, but "I won't go on meds for it because it caused muscle cramps in the past."  Dr. Redmond School is her PCP and follows her for this.  Last lipid panel as follows:  Lab Results  Component Value Date   CHOL 328 (H) 09/01/2020   HDL 32 (L) 09/01/2020  LDLCALC 221 (H) 09/01/2020   TRIG 353 (H) 09/01/2020   CHOLHDL 10.3 (H) 09/01/2020    Lab Results  Component Value Date   ALT 18 09/01/2020    Plan:  - Rec: aerobic activity with eventual goal 150+ min wk plus 2 days/ week of resistance strength training   - Cardiovascular risk and specific lipid/LDL goals reviewed.  We discussed several lifestyle modifications today and Maureen Love will continue to work on diet, exercise and weight loss efforts in addition.   - Maureen Love will start low dose Crestor every other day after long d/c pt of R/B meds.  -  I advised that she decrease saturated and trans fats by following meal plan and losing weight and increase exercise eventually.  START- rosuvastatin (CRESTOR) 5 MG  tablet; Every other night before bedtime  Dispense: 30 tablet; Refill: 0    4. Vitamin D deficiency Maureen Love Vitamin D level was 19.7 on 09/01/2020. She will start prescription vitamin D 50,000 IU each week.   Plan:  - Discussed importance of vitamin D to their health and well-being.   - possible symptoms of low Vitamin D can be low energy, depressed mood, muscle aches, joint aches, osteoporosis etc.  - low Vitamin D levels may be linked to an increased risk of cardiovascular events and even increased risk of cancers- such as colon and breast.   - I recommend pt take a weekly prescription vit D- see script below    - this may be a lifelong thing, and we will need to monitor levels regularly to keep within normal limits.   - weight loss will likely improve availability of vitamin D, thus encouraged Deyani to continue with meal plan and their weight loss efforts to further improve this condition  - Recheck Vit D level in 3 months or so.   Maureen Love will ask PCP about a bone density scan.    START- Vitamin D, Ergocalciferol, (DRISDOL) 1.25 MG (50000 UNIT) CAPS capsule; Take 1 capsule (50,000 Units total) by mouth every 7 (seven) days.  Dispense: 4 capsule; Refill: 0     5. Uncomplicated asthma, unspecified asthma severity, unspecified whether persistent Maureen Love is on albuterol and Symbicort inhalers for treatment of asthma.  Plan: - Asthma is stable and Trinady denies issues. She will continue medications as prescribed, per PCP.    6. Class 1 obesity with serious comorbidity and body mass index (BMI) of 34.0 to 34.9 in adult, unspecified obesity type Maureen Love is currently in the action stage of change. As such, her goal is to continue with weight loss efforts. She has agreed to the Category 1 Plan.   Exercise goals: As is  Behavioral modification strategies: increasing lean protein intake, decreasing simple carbohydrates, meal planning and cooking strategies, keeping healthy foods in the  home, holiday eating strategies  and planning for success.  Maureen Love has agreed to follow-up with our clinic in 3 weeks. She was informed of the importance of frequent follow-up visits to maximize her success with intensive lifestyle modifications for her multiple health conditions.     Objective:   Blood pressure (!) 142/82, pulse 89, temperature 97.8 F (36.6 C), height 5' (1.524 m), weight 174 lb (78.9 kg), SpO2 99 %. Body mass index is 33.98 kg/m.  General: Cooperative, alert, well developed, in no acute distress. HEENT: Conjunctivae and lids unremarkable. Cardiovascular: Regular rhythm.  Lungs: Normal work of breathing. Neurologic: No focal deficits.   Lab Results  Component Value Date   CREATININE 0.82 09/01/2020  BUN 11 09/01/2020   NA 140 09/01/2020   K 3.9 09/01/2020   CL 105 09/01/2020   CO2 23 09/01/2020   Lab Results  Component Value Date   ALT 18 09/01/2020   AST 17 09/01/2020   ALKPHOS 63 09/01/2020   BILITOT 0.4 09/01/2020   Lab Results  Component Value Date   HGBA1C 5.4 09/01/2020   HGBA1C 5.3 04/17/2017   HGBA1C 5.2 07/21/2014   Lab Results  Component Value Date   INSULIN 6.9 09/01/2020   Lab Results  Component Value Date   TSH 3.450 09/01/2020   Lab Results  Component Value Date   CHOL 328 (H) 09/01/2020   HDL 32 (L) 09/01/2020   LDLCALC 221 (H) 09/01/2020   TRIG 353 (H) 09/01/2020   CHOLHDL 10.3 (H) 09/01/2020   Lab Results  Component Value Date   WBC 5.5 09/01/2020   HGB 13.0 09/01/2020   HCT 38.8 09/01/2020   MCV 89 09/01/2020   PLT 282 09/01/2020     Obesity Behavioral Intervention:   Approximately 15 minutes were spent on the discussion below.  ASK: We discussed the diagnosis of obesity with Maureen Love today and Maureen Love agreed to give [place] permission to discuss obesity behavioral modification therapy today.  ASSESS: Maureen Love has the diagnosis of obesity and her BMI today is 34. Maureen Love is in the action stage of change.    ADVISE: Maureen Love was educated on the multiple health risks of obesity as well as the benefit of weight loss to improve her health. She was advised of the need for long term treatment and the importance of lifestyle modifications to improve her current health and to decrease her risk of future health problems.  AGREE: Multiple dietary modification options and treatment options were discussed and Maureen Love agreed to follow the recommendations documented in the above note.  ARRANGE: Maureen Love was educated on the importance of frequent visits to treat obesity as outlined per CMS and USPSTF guidelines and agreed to schedule her next follow up appointment today.  Attestation Statements:   Reviewed by clinician on day of visit: allergies, medications, problem list, medical history, surgical history, family history, social history, and previous encounter notes.  Coral Ceo, am acting as Location manager for [employer], DO.  I have reviewed the above documentation for accuracy and completeness, and I agree with the above. Marjory Sneddon, D.O.  The [place] was signed into law in 2016 which includes the topic of electronic health records.  This provides immediate access to information in MyChart.  This includes consultation notes, operative notes, office notes, lab results and pathology reports.  If you have any questions about what you read please let us know at your next visit so we can discuss your concerns and take corrective action if need be.  We are right here with you.

## 2020-09-23 DIAGNOSIS — H1045 Other chronic allergic conjunctivitis: Secondary | ICD-10-CM | POA: Diagnosis not present

## 2020-09-23 DIAGNOSIS — J301 Allergic rhinitis due to pollen: Secondary | ICD-10-CM | POA: Diagnosis not present

## 2020-09-23 DIAGNOSIS — J3089 Other allergic rhinitis: Secondary | ICD-10-CM | POA: Diagnosis not present

## 2020-09-23 DIAGNOSIS — J3081 Allergic rhinitis due to animal (cat) (dog) hair and dander: Secondary | ICD-10-CM | POA: Diagnosis not present

## 2020-09-28 ENCOUNTER — Other Ambulatory Visit: Payer: Self-pay

## 2020-09-28 DIAGNOSIS — B9681 Helicobacter pylori [H. pylori] as the cause of diseases classified elsewhere: Secondary | ICD-10-CM

## 2020-10-04 ENCOUNTER — Other Ambulatory Visit: Payer: Self-pay | Admitting: Gastroenterology

## 2020-10-04 DIAGNOSIS — K259 Gastric ulcer, unspecified as acute or chronic, without hemorrhage or perforation: Secondary | ICD-10-CM

## 2020-10-04 DIAGNOSIS — K2980 Duodenitis without bleeding: Secondary | ICD-10-CM

## 2020-10-06 ENCOUNTER — Ambulatory Visit (INDEPENDENT_AMBULATORY_CARE_PROVIDER_SITE_OTHER): Payer: PPO | Admitting: Family Medicine

## 2020-10-06 ENCOUNTER — Other Ambulatory Visit: Payer: Self-pay

## 2020-10-06 ENCOUNTER — Encounter (INDEPENDENT_AMBULATORY_CARE_PROVIDER_SITE_OTHER): Payer: Self-pay | Admitting: Family Medicine

## 2020-10-06 VITALS — BP 151/91 | HR 80 | Temp 97.8°F | Ht 60.0 in | Wt 172.0 lb

## 2020-10-06 DIAGNOSIS — G4709 Other insomnia: Secondary | ICD-10-CM | POA: Diagnosis not present

## 2020-10-06 DIAGNOSIS — E559 Vitamin D deficiency, unspecified: Secondary | ICD-10-CM

## 2020-10-06 DIAGNOSIS — Z6833 Body mass index (BMI) 33.0-33.9, adult: Secondary | ICD-10-CM

## 2020-10-06 DIAGNOSIS — J3089 Other allergic rhinitis: Secondary | ICD-10-CM | POA: Diagnosis not present

## 2020-10-06 DIAGNOSIS — J3081 Allergic rhinitis due to animal (cat) (dog) hair and dander: Secondary | ICD-10-CM | POA: Diagnosis not present

## 2020-10-06 DIAGNOSIS — F39 Unspecified mood [affective] disorder: Secondary | ICD-10-CM | POA: Diagnosis not present

## 2020-10-06 DIAGNOSIS — E669 Obesity, unspecified: Secondary | ICD-10-CM

## 2020-10-06 DIAGNOSIS — R030 Elevated blood-pressure reading, without diagnosis of hypertension: Secondary | ICD-10-CM | POA: Diagnosis not present

## 2020-10-06 DIAGNOSIS — J301 Allergic rhinitis due to pollen: Secondary | ICD-10-CM | POA: Diagnosis not present

## 2020-10-06 MED ORDER — TRAZODONE HCL 100 MG PO TABS: ORAL_TABLET | ORAL | 0 refills | Status: DC

## 2020-10-06 MED ORDER — VITAMIN D (ERGOCALCIFEROL) 1.25 MG (50000 UNIT) PO CAPS: 50000.0000 [IU] | ORAL_CAPSULE | ORAL | 0 refills | Status: DC

## 2020-10-10 ENCOUNTER — Ambulatory Visit (INDEPENDENT_AMBULATORY_CARE_PROVIDER_SITE_OTHER): Payer: PPO | Admitting: Family Medicine

## 2020-10-10 NOTE — Progress Notes (Signed)
Chief Complaint:   OBESITY Maureen Love is here to discuss her progress with her obesity treatment plan along with follow-up of her obesity related diagnoses. Maureen Love is on the Category 1 Plan and states she is following her eating plan approximately 65% of the time. Maureen Love states she is walking 25 minutes 3-4 times per week.  Today's visit was #: 3 Starting weight: 177 lbs Starting date: 09/01/2020 Today's weight: 172 lbs Today's date: 10/06/2020 Total lbs lost to date: 5 lbs Total lbs lost since last in-office visit: 2 lbs Total weight loss percentage to date: -2.82%  Interim History: Antara reports that she practiced PC/[place] over Thanksgiving. She is surprised that she lost weight. She celebrated her birthday yesterday and she has done more celebration eating and drinking lately. She is going out for brunch and dinner tomorrow to celebrate her birthday.  Assessment/Plan:   1. Elevated blood pressure reading Maureen Love is not prescribed medication for hypertension. She has no chest pain on exertion, no dyspnea on exertion, no swelling of ankles. No concerns. Maureen Love has not been checking her blood pressure at home.   BP Readings from Last 3 Encounters:  10/06/20 (!) 151/91  09/19/20 (!) 142/82  09/01/20 (!) 155/82   Plan: Patient will check blood pressure at home every 2-3 days and bring log in to the next OV for me to review. Blood pressure goal is >140/90.  2. Other insomnia Pt with c/o difficulty falling and staying asleep.  Due to emotional distress currently with husband.   Plan: Maureen Love will start trazodone, after we discussed the risks and benefits of trazodone versus other medications for insomnia. This may help with her mood and anxiety during the daytime with her husband.  Start- traZODone (DESYREL) 100 MG tablet; 0.5 -1 tabs po q hs  Dispense: 30 tablet; Refill: 0  3. Vitamin D deficiency Maureen Love's Vitamin D level was 19.7 on 09/01/2020. She is currently taking prescription vitamin D  50,000 IU each week. She denies nausea, vomiting or muscle weakness.  Plan: Continue current treatment plan. Refill Vit D for 1 month, as per below.  Refill- Vitamin D, Ergocalciferol, (DRISDOL) 1.25 MG (50000 UNIT) CAPS capsule; Take 1 capsule (50,000 Units total) by mouth every 7 (seven) days.  Dispense: 4 capsule; Refill: 0  4. Mood disorder with emotional eating Maureen Love reports that she is frustrated with her husband everyday. She caretaker stress. She denies emotional eating. Maureen Love failed Wellbutrin and Effexor in the past.  Plan: Recommend CBT or caretaker support groups. We will see if Trazodone helps some and reassess later. Recommend that she go to an exercise class once a day to get out of the house and do something emotionally healthy for herself.  5. Class 1 obesity with serious comorbidity and body mass index (BMI) of 33.0 to 33.9 in adult, unspecified obesity type Maureen Love is currently in the action stage of change. As such, her goal is to continue with weight loss efforts. She has agreed to the Category 1 Plan with breakfast options.   Exercise goals: Increase as tolerated and I recommend that she go to the [facility] for socialization.  Behavioral modification strategies: meal planning and cooking strategies and planning for success.  Maureen Love has agreed to follow-up with our clinic in 2 weeks. She was informed of the importance of frequent follow-up visits to maximize her success with intensive lifestyle modifications for her multiple health conditions.   Objective:   Blood pressure (!) 151/91, pulse 80, temperature 97.8 F (36.6  C), height 5' (1.524 m), weight 172 lb (78 kg), SpO2 99 %. Body mass index is 33.59 kg/m.  General: Cooperative, alert, well developed, in no acute distress. HEENT: Conjunctivae and lids unremarkable. Cardiovascular: Regular rhythm.  Lungs: Normal work of breathing. Neurologic: No focal deficits.   Lab Results  Component Value Date   CREATININE 0.82  09/01/2020   BUN 11 09/01/2020   NA 140 09/01/2020   K 3.9 09/01/2020   CL 105 09/01/2020   CO2 23 09/01/2020   Lab Results  Component Value Date   ALT 18 09/01/2020   AST 17 09/01/2020   ALKPHOS 63 09/01/2020   BILITOT 0.4 09/01/2020   Lab Results  Component Value Date   HGBA1C 5.4 09/01/2020   HGBA1C 5.3 04/17/2017   HGBA1C 5.2 07/21/2014   Lab Results  Component Value Date   INSULIN 6.9 09/01/2020   Lab Results  Component Value Date   TSH 3.450 09/01/2020   Lab Results  Component Value Date   CHOL 328 (H) 09/01/2020   HDL 32 (L) 09/01/2020   LDLCALC 221 (H) 09/01/2020   TRIG 353 (H) 09/01/2020   CHOLHDL 10.3 (H) 09/01/2020   Lab Results  Component Value Date   WBC 5.5 09/01/2020   HGB 13.0 09/01/2020   HCT 38.8 09/01/2020   MCV 89 09/01/2020   PLT 282 09/01/2020   No results found for: IRON, TIBC, FERRITIN  Obesity Behavioral Intervention:   Approximately 15 minutes were spent on the discussion below.  ASK: We discussed the diagnosis of obesity with Maureen Love today and Maureen Love agreed to give [place] permission to discuss obesity behavioral modification therapy today.  ASSESS: Maureen Love has the diagnosis of obesity and her BMI today is 33.6. Maureen Love is in the action stage of change.   ADVISE: Maureen Love was educated on the multiple health risks of obesity as well as the benefit of weight loss to improve her health. She was advised of the need for long term treatment and the importance of lifestyle modifications to improve her current health and to decrease her risk of future health problems.  AGREE: Multiple dietary modification options and treatment options were discussed and Maureen Love agreed to follow the recommendations documented in the above note.  ARRANGE: Maureen Love was educated on the importance of frequent visits to treat obesity as outlined per CMS and USPSTF guidelines and agreed to schedule her next follow up appointment today.  Attestation Statements:   Reviewed by  clinician on day of visit: allergies, medications, problem list, medical history, surgical history, family history, social history, and previous encounter notes.  Coral Ceo, am acting as Location manager for [employer], DO.  I have reviewed the above documentation for accuracy and completeness, and I agree with the above. Marjory Sneddon, D.O.  The [place] was signed into law in 2016 which includes the topic of electronic health records.  This provides immediate access to information in MyChart.  This includes consultation notes, operative notes, office notes, lab results and pathology reports.  If you have any questions about what you read please let us know at your next visit so we can discuss your concerns and take corrective action if need be.  We are right here with you.

## 2020-10-18 ENCOUNTER — Ambulatory Visit: Payer: PPO

## 2020-10-19 ENCOUNTER — Telehealth: Payer: Self-pay | Admitting: Gastroenterology

## 2020-10-19 NOTE — Telephone Encounter (Signed)
Oh no. I hope that she doesn't get it and that she has a speedy recovery. Please ask her to reschedule when she is feeling better. Thanks.

## 2020-10-19 NOTE — Telephone Encounter (Signed)
Good morning Dr. Tarri Glenn, this patient called and rescheduled her procedure scheduled from 10/24/2020 to 11/14/2020.  Stated sister has covid and patient was showing symptoms.

## 2020-10-19 NOTE — Telephone Encounter (Signed)
Pt has already rescheduled her appt.

## 2020-10-24 ENCOUNTER — Encounter: Payer: PPO | Admitting: Gastroenterology

## 2020-10-25 ENCOUNTER — Ambulatory Visit (INDEPENDENT_AMBULATORY_CARE_PROVIDER_SITE_OTHER): Payer: PPO | Admitting: Family Medicine

## 2020-11-01 ENCOUNTER — Ambulatory Visit: Payer: PPO

## 2020-11-01 ENCOUNTER — Other Ambulatory Visit (INDEPENDENT_AMBULATORY_CARE_PROVIDER_SITE_OTHER): Payer: Self-pay | Admitting: Family Medicine

## 2020-11-01 DIAGNOSIS — E7849 Other hyperlipidemia: Secondary | ICD-10-CM

## 2020-11-01 DIAGNOSIS — E559 Vitamin D deficiency, unspecified: Secondary | ICD-10-CM

## 2020-11-08 DIAGNOSIS — J3089 Other allergic rhinitis: Secondary | ICD-10-CM | POA: Diagnosis not present

## 2020-11-08 DIAGNOSIS — J301 Allergic rhinitis due to pollen: Secondary | ICD-10-CM | POA: Diagnosis not present

## 2020-11-08 DIAGNOSIS — J3081 Allergic rhinitis due to animal (cat) (dog) hair and dander: Secondary | ICD-10-CM | POA: Diagnosis not present

## 2020-11-13 ENCOUNTER — Encounter: Payer: Self-pay | Admitting: Certified Registered Nurse Anesthetist

## 2020-11-14 ENCOUNTER — Encounter: Payer: Self-pay | Admitting: Gastroenterology

## 2020-11-14 ENCOUNTER — Ambulatory Visit (AMBULATORY_SURGERY_CENTER): Payer: PPO | Admitting: Gastroenterology

## 2020-11-14 ENCOUNTER — Other Ambulatory Visit: Payer: Self-pay

## 2020-11-14 VITALS — BP 179/83 | HR 81 | Temp 97.9°F | Resp 27 | Ht 61.0 in | Wt 179.0 lb

## 2020-11-14 DIAGNOSIS — K279 Peptic ulcer, site unspecified, unspecified as acute or chronic, without hemorrhage or perforation: Secondary | ICD-10-CM

## 2020-11-14 DIAGNOSIS — K2970 Gastritis, unspecified, without bleeding: Secondary | ICD-10-CM

## 2020-11-14 DIAGNOSIS — K259 Gastric ulcer, unspecified as acute or chronic, without hemorrhage or perforation: Secondary | ICD-10-CM | POA: Diagnosis not present

## 2020-11-14 DIAGNOSIS — K2950 Unspecified chronic gastritis without bleeding: Secondary | ICD-10-CM | POA: Diagnosis not present

## 2020-11-14 DIAGNOSIS — B9681 Helicobacter pylori [H. pylori] as the cause of diseases classified elsewhere: Secondary | ICD-10-CM | POA: Diagnosis not present

## 2020-11-14 DIAGNOSIS — Z09 Encounter for follow-up examination after completed treatment for conditions other than malignant neoplasm: Secondary | ICD-10-CM | POA: Diagnosis not present

## 2020-11-14 DIAGNOSIS — J45909 Unspecified asthma, uncomplicated: Secondary | ICD-10-CM | POA: Diagnosis not present

## 2020-11-14 DIAGNOSIS — I10 Essential (primary) hypertension: Secondary | ICD-10-CM | POA: Diagnosis not present

## 2020-11-14 MED ORDER — SODIUM CHLORIDE 0.9 % IV SOLN: 500.0000 mL | Freq: Once | INTRAVENOUS | Status: AC

## 2020-11-14 NOTE — Progress Notes (Signed)
1019 Robinul 0.1 mg IV given due large amount of secretions upon assessment.  MD made aware, vss 

## 2020-11-14 NOTE — Patient Instructions (Signed)
YOU HAD AN ENDOSCOPIC PROCEDURE TODAY AT THE [facility]:   Refer to the procedure report that was given to you for any specific questions about what was found during the examination.  If the procedure report does not answer your questions, please call your gastroenterologist to clarify.  If you requested that your care partner not be given the details of your procedure findings, then the procedure report has been included in a sealed envelope for you to review at your convenience later.  YOU SHOULD EXPECT: Some feelings of bloating in the abdomen. Passage of more gas than usual.  Walking can help get rid of the air that was put into your GI tract during the procedure and reduce the bloating. If you had a lower endoscopy (such as a colonoscopy or flexible sigmoidoscopy) you may notice spotting of blood in your stool or on the toilet paper. If you underwent a bowel prep for your procedure, you may not have a normal bowel movement for a few days.  Please Note:  You might notice some irritation and congestion in your nose or some drainage.  This is from the oxygen used during your procedure.  There is no need for concern and it should clear up in a day or so.  SYMPTOMS TO REPORT IMMEDIATELY:   Following lower endoscopy (colonoscopy or flexible sigmoidoscopy):  Excessive amounts of blood in the stool  Significant tenderness or worsening of abdominal pains  Swelling of the abdomen that is new, acute  Fever of 100F or higher   Following upper endoscopy (EGD)  Vomiting of blood or coffee ground material  New chest pain or pain under the shoulder blades  Painful or persistently difficult swallowing  New shortness of breath  Fever of 100F or higher  Black, tarry-looking stools  For urgent or emergent issues, a gastroenterologist can be reached at any hour by calling [phone]. Do not use MyChart messaging for urgent concerns.    DIET:  We do recommend a small meal at first, but  then you may proceed to your regular diet.  Drink plenty of fluids but you should avoid alcoholic beverages for 24 hours.  ACTIVITY:  You should plan to take it easy for the rest of today and you should NOT DRIVE or use heavy machinery until tomorrow (because of the sedation medicines used during the test).    FOLLOW UP: Our staff will call the number listed on your records 48-72 hours following your procedure to check on you and address any questions or concerns that you may have regarding the information given to you following your procedure. If we do not reach you, we will leave a message.  We will attempt to reach you two times.  During this call, we will ask if you have developed any symptoms of COVID 19. If you develop any symptoms (ie: fever, flu-like symptoms, shortness of breath, cough etc.) before then, please call [phone].  If you test positive for Covid 19 in the 2 weeks post procedure, please call and report this information to us.    If any biopsies were taken you will be contacted by phone or by letter within the next 1-3 weeks.  Please call us at [phone] if you have not heard about the biopsies in 3 weeks.    SIGNATURES/CONFIDENTIALITY: You and/or your care partner have signed paperwork which will be entered into your electronic medical record.  These signatures attest to the fact that that the information above on   your After Visit Summary has been reviewed and is understood.  Full responsibility of the confidentiality of this discharge information lies with you and/or your care-partner. 

## 2020-11-14 NOTE — Op Note (Signed)
[facility] Patient Name: Maureen Love Procedure Date: 11/14/2020 10:14 AM MRN: [id number] Endoscopist: Thornton Park MD, MD Age: 70 Referring MD:  Date of Birth: 02-26-1951 Gender: Female Account #: [id number] Procedure:                Upper GI endoscopy Indications:              Follow-up of gastric ulcer, Abdominal bloating                           EGD 04/27/20 for bloating showed an H pylori gastric                            ulcers, symptoms initially improved with treatment,                            but recurred Medicines:                Monitored Anesthesia Care Procedure:                Pre-Anesthesia Assessment:                           - Prior to the procedure, a History and Physical                            was performed, and patient medications and                            allergies were reviewed. The patient's tolerance of                            previous anesthesia was also reviewed. The risks                            and benefits of the procedure and the sedation                            options and risks were discussed with the patient.                            All questions were answered, and informed consent                            was obtained. Prior Anticoagulants: The patient has                            taken no previous anticoagulant or antiplatelet                            agents. ASA Grade Assessment: II - A patient with                            mild systemic disease. After reviewing the risks  and benefits, the patient was deemed in                            satisfactory condition to undergo the procedure.                           After obtaining informed consent, the endoscope was                            passed under direct vision. Throughout the                            procedure, the patient's blood pressure, pulse, and                            oxygen saturations were monitored  continuously. The                            Endoscope was introduced through the mouth, and                            advanced to the third part of duodenum. The upper                            GI endoscopy was accomplished without difficulty.                            The patient tolerated the procedure well. Scope In: Scope Out: Findings:                 The esophagus was normal. The z-line is located 36                            cm from the incisors.                           Patchy mild inflammation characterized by erythema,                            friability and granularity was found in the gastric                            body and fundus. Biopsies were taken from the                            antrum, body, and fundus with a cold forceps for                            histology. Estimated blood loss was minimal.                           A few localized small erosions with no bleeding and  no stigmata of recent bleeding were found in the                            gastric antrum.                           The examined duodenum was normal.                           The cardia and gastric fundus were normal on                            retroflexion.                           The exam was otherwise without abnormality. Complications:            No immediate complications. Estimated blood loss:                            Minimal. Estimated Blood Loss:     Estimated blood loss was minimal. Impression:               - Normal esophagus.                           - Gastritis. Biopsied.                           - Erosive gastropathy with no bleeding and no                            stigmata of recent bleeding.                           - Normal examined duodenum.                           - The examination was otherwise normal. Recommendation:           - Patient has a contact number available for                            emergencies. The signs  and symptoms of potential                            delayed complications were discussed with the                            patient. Return to normal activities tomorrow.                            Written discharge instructions were provided to the                            patient.                           -  Resume previous diet.                           - Continue present medications.                           - No aspirin, ibuprofen, naproxen, or other                            non-steroidal anti-inflammatory drugs.                           - Await pathology results. Thornton Park MD, MD 11/14/2020 10:36:34 AM This report has been signed electronically.

## 2020-11-14 NOTE — Progress Notes (Signed)
Called to room to assist during endoscopic procedure.  Patient ID and intended procedure confirmed with present staff. Received instructions for my participation in the procedure from the performing physician.  

## 2020-11-16 ENCOUNTER — Ambulatory Visit (INDEPENDENT_AMBULATORY_CARE_PROVIDER_SITE_OTHER): Payer: PPO | Admitting: Family Medicine

## 2020-11-16 ENCOUNTER — Telehealth: Payer: Self-pay

## 2020-11-16 NOTE — Telephone Encounter (Signed)
  Follow up Call-  Call back number 11/14/2020 04/27/2020  Post procedure Call Back phone  # [phone]  Permission to leave phone message Yes Yes  Some recent data might be hidden     Left message

## 2020-11-23 ENCOUNTER — Other Ambulatory Visit: Payer: Self-pay

## 2020-11-24 ENCOUNTER — Other Ambulatory Visit: Payer: Self-pay

## 2020-11-24 DIAGNOSIS — A048 Other specified bacterial intestinal infections: Secondary | ICD-10-CM

## 2020-11-24 MED ORDER — BIS SUBCIT-METRONID-TETRACYC 140-125-125 MG PO CAPS: 3.0000 | ORAL_CAPSULE | Freq: Three times a day (TID) | ORAL | 0 refills | Status: DC

## 2020-11-28 ENCOUNTER — Telehealth: Payer: Self-pay | Admitting: Gastroenterology

## 2020-11-28 ENCOUNTER — Telehealth: Payer: Self-pay | Admitting: Family Medicine

## 2020-11-28 NOTE — Telephone Encounter (Signed)
Please find out the H pylori alternatives covered by her insurance in this situation given her resistance to her first course of therapy. Thank you.

## 2020-11-28 NOTE — Telephone Encounter (Signed)
LVM requesting returned call 

## 2020-11-28 NOTE — Telephone Encounter (Signed)
Please review and advise.

## 2020-11-28 NOTE — Telephone Encounter (Signed)
Inbound call from patient stating pylera medication is not covered by her insurance and will be over $1000 out of pocket which she can not afford.  Wants to know if there is an alternate medication to it.  Please advise.

## 2020-11-28 NOTE — Telephone Encounter (Signed)
Pt wife called and states pt needs handicapp placard, he has balance issues and now being tested for parkinson's . Form in your folder.

## 2020-11-29 ENCOUNTER — Encounter: Payer: Self-pay | Admitting: Family Medicine

## 2020-11-29 ENCOUNTER — Ambulatory Visit: Payer: PPO

## 2020-11-29 ENCOUNTER — Other Ambulatory Visit: Payer: Self-pay

## 2020-11-29 ENCOUNTER — Ambulatory Visit (INDEPENDENT_AMBULATORY_CARE_PROVIDER_SITE_OTHER): Payer: PPO | Admitting: Family Medicine

## 2020-11-29 VITALS — BP 166/90 | HR 92 | Temp 99.1°F | Wt 170.8 lb

## 2020-11-29 DIAGNOSIS — A048 Other specified bacterial intestinal infections: Secondary | ICD-10-CM

## 2020-11-29 DIAGNOSIS — K279 Peptic ulcer, site unspecified, unspecified as acute or chronic, without hemorrhage or perforation: Secondary | ICD-10-CM

## 2020-11-29 DIAGNOSIS — F419 Anxiety disorder, unspecified: Secondary | ICD-10-CM | POA: Diagnosis not present

## 2020-11-29 DIAGNOSIS — F439 Reaction to severe stress, unspecified: Secondary | ICD-10-CM | POA: Diagnosis not present

## 2020-11-29 DIAGNOSIS — B9681 Helicobacter pylori [H. pylori] as the cause of diseases classified elsewhere: Secondary | ICD-10-CM

## 2020-11-29 MED ORDER — ALPRAZOLAM 0.25 MG PO TABS: 0.2500 mg | ORAL_TABLET | Freq: Two times a day (BID) | ORAL | 0 refills | Status: DC | PRN

## 2020-11-29 MED ORDER — CITALOPRAM HYDROBROMIDE 20 MG PO TABS: 20.0000 mg | ORAL_TABLET | Freq: Every day | ORAL | 3 refills | Status: DC

## 2020-11-29 MED ORDER — HELIDAC THERAPY PO MISC: ORAL | 0 refills | Status: DC

## 2020-11-29 NOTE — Telephone Encounter (Signed)
Outpatient Medication Detail   Disp Refills Start End   Metronid-Tetracyc-Bis Subsal [facility] THERAPY) MISC 1 each 0 11/29/2020    Sig: Take entire package as directed until gone   Sent to pharmacy as: Metronid-Tetracyc-Bis Subsal [facility] THERAPY) Misc   Notes to Pharmacy: PATIENT WILL NEED TO CALL INSURANCE IF UNAFFORDABLE OR NOT COVERED BY INS.   E-Prescribing Status: Receipt confirmed by pharmacy (11/29/2020  4:45 PM EST)    Called pt to inform her about Dr. Tarri Glenn recommendation above. Unable to reach d/t no answer.

## 2020-11-29 NOTE — Telephone Encounter (Signed)
Please send a prescription to her pharmacy for Helidac for 14 days. Hopefully this will be more affordable. If not, does the pharmacist have access to the formulary to suggest a better options, as this is her second course of treatment for H pylori. Thank you.

## 2020-11-29 NOTE — Telephone Encounter (Signed)
Called pt to inform her about Dr. Tarri Glenn response below. Before proceeding with my name or the company for which I worked, pt proceeded to hastily inform me about the number of calls she has made to our office and the number of times she had been placed on hold. I apologized for the inconvenience this had caused, when pt proceeded to again repeat the exact same frustration. Again, apologized for the inconvenience and reassured her that I was thankful to have reached her personally to resolve her concern. Proceeded to inform her of Dr. Tarri Glenn response. Pt then stated, "let me tell you something, I am moving in to a condo, I have a husband with Parkinson's, I have numerous other obligations and you are going to tell me (pt proceeded to raise her voice loudly) I NEED TO [place] to find out this information", pt proceeded to again raise her voice stating, "I HAVE NO TIME SO JUST FORGET IT", then proceeded to disconnect our call.

## 2020-11-29 NOTE — Progress Notes (Signed)
   Subjective:    Patient ID: Maureen Love, female    DOB: 15-Nov-1950, 70 y.o.   MRN: [id number]  HPI She is here for consult concerning continued difficulty with stress and anxiety. Her husband is having a lot of difficulty with neurologic complications and possible Parkinson's disease. He is in the process of trying to get evaluated but there is been some hold-up in getting a referral. They are in the process of trying to sell her house and buy a condo. All the stress concerning this has fallen on her since her husband is really unable to really help much physically or emotionally. She admits to being agitated, angry, not sleeping well, irritable and crying. She is also noted weight loss.   Review of Systems     Objective:   Physical Exam Alert and in no apparent distress but tearful and quite agitated.       Assessment & Plan:  Anxiety - Plan: ALPRAZolam (XANAX) 0.25 MG tablet, citalopram (CELEXA) 20 MG tablet  Stress at home - Plan: ALPRAZolam (XANAX) 0.25 MG tablet, citalopram (CELEXA) 20 MG tablet Discussed the use of these medications to help calm things down. Also we will help get an appointment for her husband with neurology. Discussed possible use of support groups if he indeed does have Parkinson's disease. She is to contact me in 2 weeks to let me know how her anxiety is doing as well as sleep and for a general progress report. 35 minutes spent counseling this patient.

## 2020-11-29 NOTE — Patient Instructions (Signed)
Call Dr. Tarri Glenn office back and ask them for a less expensive alternative

## 2020-11-30 ENCOUNTER — Encounter: Payer: Self-pay | Admitting: Family Medicine

## 2020-11-30 ENCOUNTER — Other Ambulatory Visit: Payer: Self-pay

## 2020-11-30 DIAGNOSIS — K2980 Duodenitis without bleeding: Secondary | ICD-10-CM

## 2020-11-30 DIAGNOSIS — K259 Gastric ulcer, unspecified as acute or chronic, without hemorrhage or perforation: Secondary | ICD-10-CM

## 2020-11-30 DIAGNOSIS — A048 Other specified bacterial intestinal infections: Secondary | ICD-10-CM

## 2020-11-30 MED ORDER — BISMUTH SUBSALICYLATE 262 MG PO TABS: 1.0000 | ORAL_TABLET | Freq: Four times a day (QID) | ORAL | 0 refills | Status: AC

## 2020-11-30 MED ORDER — LEVOFLOXACIN 500 MG PO TABS: 500.0000 mg | ORAL_TABLET | Freq: Every day | ORAL | 0 refills | Status: AC

## 2020-11-30 MED ORDER — AMOXICILLIN 500 MG PO CAPS: 1000.0000 mg | ORAL_CAPSULE | Freq: Two times a day (BID) | ORAL | 0 refills | Status: AC

## 2020-11-30 MED ORDER — PANTOPRAZOLE SODIUM 40 MG PO TBEC: 40.0000 mg | DELAYED_RELEASE_TABLET | Freq: Two times a day (BID) | ORAL | 0 refills | Status: DC

## 2020-12-07 DIAGNOSIS — J3089 Other allergic rhinitis: Secondary | ICD-10-CM | POA: Diagnosis not present

## 2020-12-07 DIAGNOSIS — J3081 Allergic rhinitis due to animal (cat) (dog) hair and dander: Secondary | ICD-10-CM | POA: Diagnosis not present

## 2020-12-07 DIAGNOSIS — J301 Allergic rhinitis due to pollen: Secondary | ICD-10-CM | POA: Diagnosis not present

## 2020-12-12 ENCOUNTER — Other Ambulatory Visit: Payer: Self-pay

## 2020-12-12 MED ORDER — METHYLPREDNISOLONE 4 MG PO TBPK: ORAL_TABLET | ORAL | 0 refills | Status: DC

## 2020-12-13 ENCOUNTER — Ambulatory Visit: Payer: PPO | Admitting: Family Medicine

## 2020-12-15 ENCOUNTER — Encounter: Payer: Self-pay | Admitting: Family Medicine

## 2020-12-17 ENCOUNTER — Encounter: Payer: Self-pay | Admitting: Family Medicine

## 2020-12-17 DIAGNOSIS — G479 Sleep disorder, unspecified: Secondary | ICD-10-CM

## 2020-12-19 ENCOUNTER — Encounter: Payer: Self-pay | Admitting: Family Medicine

## 2020-12-19 MED ORDER — ZOLPIDEM TARTRATE 5 MG PO TABS: 5.0000 mg | ORAL_TABLET | Freq: Every evening | ORAL | 0 refills | Status: DC | PRN

## 2020-12-22 ENCOUNTER — Encounter: Payer: Self-pay | Admitting: Family Medicine

## 2020-12-22 ENCOUNTER — Other Ambulatory Visit: Payer: Self-pay | Admitting: Family Medicine

## 2020-12-22 DIAGNOSIS — F419 Anxiety disorder, unspecified: Secondary | ICD-10-CM

## 2020-12-22 DIAGNOSIS — F439 Reaction to severe stress, unspecified: Secondary | ICD-10-CM

## 2020-12-22 NOTE — Telephone Encounter (Signed)
[facility] is requesting to fill pt celexa. Please advise K

## 2021-01-05 ENCOUNTER — Other Ambulatory Visit: Payer: Self-pay | Admitting: Gastroenterology

## 2021-01-05 ENCOUNTER — Encounter: Payer: Self-pay | Admitting: Family Medicine

## 2021-01-05 DIAGNOSIS — G479 Sleep disorder, unspecified: Secondary | ICD-10-CM

## 2021-01-05 MED ORDER — ZOLPIDEM TARTRATE 5 MG PO TABS: 5.0000 mg | ORAL_TABLET | Freq: Every evening | ORAL | 0 refills | Status: AC | PRN

## 2021-01-05 NOTE — Telephone Encounter (Signed)
Message routed to Dr. Tarri Glenn to determine if OV is required prior to refill. Will await response

## 2021-01-05 NOTE — Telephone Encounter (Signed)
Please review and advise if refill is appropriate or if you would prefer an OV given her previous concerns

## 2021-01-12 ENCOUNTER — Other Ambulatory Visit: Payer: Self-pay

## 2021-01-12 DIAGNOSIS — K259 Gastric ulcer, unspecified as acute or chronic, without hemorrhage or perforation: Secondary | ICD-10-CM

## 2021-01-12 DIAGNOSIS — K2980 Duodenitis without bleeding: Secondary | ICD-10-CM

## 2021-01-12 MED ORDER — METRONIDAZOLE 250 MG PO TABS: 250.0000 mg | ORAL_TABLET | Freq: Four times a day (QID) | ORAL | 0 refills | Status: DC

## 2021-01-12 MED ORDER — TETRACYCLINE HCL 500 MG PO CAPS: 500.0000 mg | ORAL_CAPSULE | Freq: Four times a day (QID) | ORAL | 0 refills | Status: DC

## 2021-01-12 MED ORDER — PANTOPRAZOLE SODIUM 40 MG PO TBEC: 40.0000 mg | DELAYED_RELEASE_TABLET | Freq: Two times a day (BID) | ORAL | 0 refills | Status: DC

## 2021-01-12 MED ORDER — BISMUTH 262 MG PO CHEW: 524.0000 mg | CHEWABLE_TABLET | Freq: Four times a day (QID) | ORAL | 0 refills | Status: DC

## 2021-02-01 ENCOUNTER — Ambulatory Visit: Payer: PPO | Admitting: Gastroenterology

## 2021-02-08 DIAGNOSIS — Z7689 Persons encountering health services in other specified circumstances: Secondary | ICD-10-CM | POA: Diagnosis not present

## 2021-02-08 DIAGNOSIS — Z1231 Encounter for screening mammogram for malignant neoplasm of breast: Secondary | ICD-10-CM | POA: Diagnosis not present

## 2021-02-08 DIAGNOSIS — D171 Benign lipomatous neoplasm of skin and subcutaneous tissue of trunk: Secondary | ICD-10-CM | POA: Diagnosis not present

## 2021-02-08 DIAGNOSIS — Z638 Other specified problems related to primary support group: Secondary | ICD-10-CM | POA: Diagnosis not present

## 2021-02-08 DIAGNOSIS — J302 Other seasonal allergic rhinitis: Secondary | ICD-10-CM | POA: Diagnosis not present

## 2021-02-08 DIAGNOSIS — J4530 Mild persistent asthma, uncomplicated: Secondary | ICD-10-CM | POA: Diagnosis not present

## 2021-02-08 DIAGNOSIS — K649 Unspecified hemorrhoids: Secondary | ICD-10-CM | POA: Diagnosis not present

## 2021-02-08 DIAGNOSIS — R454 Irritability and anger: Secondary | ICD-10-CM | POA: Diagnosis not present

## 2021-02-20 DIAGNOSIS — R222 Localized swelling, mass and lump, trunk: Secondary | ICD-10-CM | POA: Diagnosis not present

## 2021-02-28 DIAGNOSIS — F419 Anxiety disorder, unspecified: Secondary | ICD-10-CM | POA: Diagnosis not present

## 2021-02-28 DIAGNOSIS — R222 Localized swelling, mass and lump, trunk: Secondary | ICD-10-CM | POA: Diagnosis not present

## 2021-02-28 DIAGNOSIS — Z880 Allergy status to penicillin: Secondary | ICD-10-CM | POA: Diagnosis not present

## 2021-02-28 DIAGNOSIS — Z885 Allergy status to narcotic agent status: Secondary | ICD-10-CM | POA: Diagnosis not present

## 2021-02-28 DIAGNOSIS — I10 Essential (primary) hypertension: Secondary | ICD-10-CM | POA: Diagnosis not present

## 2021-02-28 DIAGNOSIS — G43009 Migraine without aura, not intractable, without status migrainosus: Secondary | ICD-10-CM | POA: Diagnosis not present

## 2021-02-28 DIAGNOSIS — J45909 Unspecified asthma, uncomplicated: Secondary | ICD-10-CM | POA: Diagnosis not present

## 2021-02-28 DIAGNOSIS — M7989 Other specified soft tissue disorders: Secondary | ICD-10-CM | POA: Diagnosis not present

## 2021-02-28 DIAGNOSIS — E785 Hyperlipidemia, unspecified: Secondary | ICD-10-CM | POA: Diagnosis not present

## 2021-02-28 DIAGNOSIS — K219 Gastro-esophageal reflux disease without esophagitis: Secondary | ICD-10-CM | POA: Diagnosis not present

## 2021-02-28 DIAGNOSIS — Z79899 Other long term (current) drug therapy: Secondary | ICD-10-CM | POA: Diagnosis not present

## 2021-02-28 DIAGNOSIS — M1990 Unspecified osteoarthritis, unspecified site: Secondary | ICD-10-CM | POA: Diagnosis not present

## 2021-02-28 DIAGNOSIS — F32A Depression, unspecified: Secondary | ICD-10-CM | POA: Diagnosis not present

## 2021-02-28 DIAGNOSIS — Z6831 Body mass index (BMI) 31.0-31.9, adult: Secondary | ICD-10-CM | POA: Diagnosis not present

## 2021-02-28 DIAGNOSIS — E669 Obesity, unspecified: Secondary | ICD-10-CM | POA: Diagnosis not present

## 2021-03-03 DIAGNOSIS — Z780 Asymptomatic menopausal state: Secondary | ICD-10-CM | POA: Diagnosis not present

## 2021-03-03 DIAGNOSIS — Z13820 Encounter for screening for osteoporosis: Secondary | ICD-10-CM | POA: Diagnosis not present

## 2021-03-03 DIAGNOSIS — Z1231 Encounter for screening mammogram for malignant neoplasm of breast: Secondary | ICD-10-CM | POA: Diagnosis not present

## 2021-03-03 DIAGNOSIS — M85852 Other specified disorders of bone density and structure, left thigh: Secondary | ICD-10-CM | POA: Diagnosis not present

## 2021-03-03 DIAGNOSIS — M8588 Other specified disorders of bone density and structure, other site: Secondary | ICD-10-CM | POA: Diagnosis not present

## 2021-04-10 DIAGNOSIS — H02846 Edema of left eye, unspecified eyelid: Secondary | ICD-10-CM | POA: Diagnosis not present

## 2021-04-10 DIAGNOSIS — R454 Irritability and anger: Secondary | ICD-10-CM | POA: Diagnosis not present

## 2021-04-10 DIAGNOSIS — J302 Other seasonal allergic rhinitis: Secondary | ICD-10-CM | POA: Diagnosis not present

## 2021-04-10 DIAGNOSIS — Z638 Other specified problems related to primary support group: Secondary | ICD-10-CM | POA: Diagnosis not present

## 2021-04-21 DIAGNOSIS — Z23 Encounter for immunization: Secondary | ICD-10-CM | POA: Diagnosis not present

## 2021-04-21 DIAGNOSIS — S61209A Unspecified open wound of unspecified finger without damage to nail, initial encounter: Secondary | ICD-10-CM | POA: Diagnosis not present

## 2021-04-21 DIAGNOSIS — M7989 Other specified soft tissue disorders: Secondary | ICD-10-CM | POA: Diagnosis not present

## 2021-05-04 DIAGNOSIS — L814 Other melanin hyperpigmentation: Secondary | ICD-10-CM | POA: Diagnosis not present

## 2021-05-04 DIAGNOSIS — D2239 Melanocytic nevi of other parts of face: Secondary | ICD-10-CM | POA: Diagnosis not present

## 2021-07-11 DIAGNOSIS — E7849 Other hyperlipidemia: Secondary | ICD-10-CM | POA: Diagnosis not present

## 2021-07-11 DIAGNOSIS — R635 Abnormal weight gain: Secondary | ICD-10-CM | POA: Diagnosis not present

## 2021-07-11 DIAGNOSIS — D171 Benign lipomatous neoplasm of skin and subcutaneous tissue of trunk: Secondary | ICD-10-CM | POA: Diagnosis not present

## 2021-07-11 DIAGNOSIS — D1721 Benign lipomatous neoplasm of skin and subcutaneous tissue of right arm: Secondary | ICD-10-CM | POA: Diagnosis not present

## 2021-07-11 DIAGNOSIS — E559 Vitamin D deficiency, unspecified: Secondary | ICD-10-CM | POA: Diagnosis not present

## 2021-07-11 DIAGNOSIS — Z0000 Encounter for general adult medical examination without abnormal findings: Secondary | ICD-10-CM | POA: Diagnosis not present

## 2021-08-30 DIAGNOSIS — R002 Palpitations: Secondary | ICD-10-CM | POA: Diagnosis not present

## 2021-08-30 DIAGNOSIS — R21 Rash and other nonspecific skin eruption: Secondary | ICD-10-CM | POA: Diagnosis not present

## 2021-08-30 DIAGNOSIS — F419 Anxiety disorder, unspecified: Secondary | ICD-10-CM | POA: Diagnosis not present

## 2021-08-30 DIAGNOSIS — F410 Panic disorder [episodic paroxysmal anxiety] without agoraphobia: Secondary | ICD-10-CM | POA: Diagnosis not present

## 2021-08-30 DIAGNOSIS — R0789 Other chest pain: Secondary | ICD-10-CM | POA: Diagnosis not present

## 2021-11-16 DIAGNOSIS — H52221 Regular astigmatism, right eye: Secondary | ICD-10-CM | POA: Diagnosis not present

## 2021-11-16 DIAGNOSIS — H524 Presbyopia: Secondary | ICD-10-CM | POA: Diagnosis not present

## 2021-11-16 DIAGNOSIS — H5203 Hypermetropia, bilateral: Secondary | ICD-10-CM | POA: Diagnosis not present

## 2021-11-16 DIAGNOSIS — H25813 Combined forms of age-related cataract, bilateral: Secondary | ICD-10-CM | POA: Diagnosis not present

## 2021-11-16 DIAGNOSIS — H02831 Dermatochalasis of right upper eyelid: Secondary | ICD-10-CM | POA: Diagnosis not present

## 2021-11-16 DIAGNOSIS — H02834 Dermatochalasis of left upper eyelid: Secondary | ICD-10-CM | POA: Diagnosis not present

## 2021-11-21 ENCOUNTER — Encounter: Payer: Self-pay | Admitting: Gastroenterology

## 2021-12-01 DIAGNOSIS — H02831 Dermatochalasis of right upper eyelid: Secondary | ICD-10-CM | POA: Diagnosis not present

## 2021-12-01 DIAGNOSIS — H02834 Dermatochalasis of left upper eyelid: Secondary | ICD-10-CM | POA: Diagnosis not present

## 2021-12-05 ENCOUNTER — Telehealth: Payer: Self-pay

## 2021-12-05 NOTE — Telephone Encounter (Signed)
Left message for patient to please call back. 

## 2021-12-06 ENCOUNTER — Telehealth: Payer: Self-pay

## 2021-12-06 NOTE — Telephone Encounter (Signed)
Left message again for patient to please call back

## 2021-12-07 ENCOUNTER — Telehealth: Payer: Self-pay

## 2021-12-07 ENCOUNTER — Encounter: Payer: Self-pay | Admitting: Gastroenterology

## 2021-12-07 NOTE — Telephone Encounter (Signed)
Letter mailed to pt home address to remind her to complete her stool study. Refer to letters.

## 2021-12-21 DIAGNOSIS — Z01818 Encounter for other preprocedural examination: Secondary | ICD-10-CM | POA: Diagnosis not present

## 2021-12-21 DIAGNOSIS — H02831 Dermatochalasis of right upper eyelid: Secondary | ICD-10-CM | POA: Diagnosis not present

## 2021-12-21 DIAGNOSIS — H02834 Dermatochalasis of left upper eyelid: Secondary | ICD-10-CM | POA: Diagnosis not present

## 2022-01-04 DIAGNOSIS — H02834 Dermatochalasis of left upper eyelid: Secondary | ICD-10-CM | POA: Diagnosis not present

## 2022-01-04 DIAGNOSIS — H02831 Dermatochalasis of right upper eyelid: Secondary | ICD-10-CM | POA: Diagnosis not present

## 2022-01-17 DIAGNOSIS — M79604 Pain in right leg: Secondary | ICD-10-CM | POA: Diagnosis not present

## 2022-01-17 DIAGNOSIS — F419 Anxiety disorder, unspecified: Secondary | ICD-10-CM | POA: Diagnosis not present

## 2022-01-17 DIAGNOSIS — E7849 Other hyperlipidemia: Secondary | ICD-10-CM | POA: Diagnosis not present

## 2022-01-17 DIAGNOSIS — F4310 Post-traumatic stress disorder, unspecified: Secondary | ICD-10-CM | POA: Diagnosis not present

## 2022-01-17 DIAGNOSIS — F39 Unspecified mood [affective] disorder: Secondary | ICD-10-CM | POA: Diagnosis not present

## 2022-01-17 DIAGNOSIS — F4321 Adjustment disorder with depressed mood: Secondary | ICD-10-CM | POA: Diagnosis not present

## 2022-01-29 DIAGNOSIS — M79604 Pain in right leg: Secondary | ICD-10-CM | POA: Diagnosis not present

## 2022-03-27 ENCOUNTER — Ambulatory Visit: Payer: PPO | Admitting: Dermatology

## 2022-04-16 DIAGNOSIS — R5383 Other fatigue: Secondary | ICD-10-CM | POA: Diagnosis not present

## 2022-04-16 DIAGNOSIS — Z79899 Other long term (current) drug therapy: Secondary | ICD-10-CM | POA: Diagnosis not present

## 2022-04-16 DIAGNOSIS — F4310 Post-traumatic stress disorder, unspecified: Secondary | ICD-10-CM | POA: Diagnosis not present

## 2022-04-16 DIAGNOSIS — F4321 Adjustment disorder with depressed mood: Secondary | ICD-10-CM | POA: Diagnosis not present

## 2022-04-16 DIAGNOSIS — E538 Deficiency of other specified B group vitamins: Secondary | ICD-10-CM | POA: Diagnosis not present

## 2022-04-16 DIAGNOSIS — F39 Unspecified mood [affective] disorder: Secondary | ICD-10-CM | POA: Diagnosis not present

## 2022-04-16 DIAGNOSIS — E559 Vitamin D deficiency, unspecified: Secondary | ICD-10-CM | POA: Diagnosis not present

## 2022-04-16 DIAGNOSIS — E7849 Other hyperlipidemia: Secondary | ICD-10-CM | POA: Diagnosis not present

## 2022-05-16 DIAGNOSIS — L710 Perioral dermatitis: Secondary | ICD-10-CM | POA: Diagnosis not present

## 2022-05-18 ENCOUNTER — Ambulatory Visit (HOSPITAL_COMMUNITY): Admission: RE | Admit: 2022-05-18 | Payer: PPO | Source: Home / Self Care | Admitting: Optometry

## 2022-05-18 ENCOUNTER — Encounter (HOSPITAL_COMMUNITY): Admission: RE | Payer: Self-pay | Source: Home / Self Care

## 2022-05-18 SURGERY — BLEPHAROPLASTY
Anesthesia: General | Laterality: Bilateral

## 2022-05-21 DIAGNOSIS — L710 Perioral dermatitis: Secondary | ICD-10-CM | POA: Diagnosis not present

## 2022-06-13 ENCOUNTER — Encounter (INDEPENDENT_AMBULATORY_CARE_PROVIDER_SITE_OTHER): Payer: Self-pay

## 2022-07-24 ENCOUNTER — Ambulatory Visit: Payer: PPO | Admitting: Plastic Surgery

## 2022-07-24 ENCOUNTER — Encounter: Payer: Self-pay | Admitting: Plastic Surgery

## 2022-07-24 VITALS — HR 90 | Ht 60.0 in | Wt 176.0 lb

## 2022-07-24 DIAGNOSIS — M546 Pain in thoracic spine: Secondary | ICD-10-CM

## 2022-07-24 DIAGNOSIS — M545 Low back pain, unspecified: Secondary | ICD-10-CM

## 2022-07-24 DIAGNOSIS — Z6834 Body mass index (BMI) 34.0-34.9, adult: Secondary | ICD-10-CM | POA: Diagnosis not present

## 2022-07-24 DIAGNOSIS — J4520 Mild intermittent asthma, uncomplicated: Secondary | ICD-10-CM

## 2022-07-24 DIAGNOSIS — N62 Hypertrophy of breast: Secondary | ICD-10-CM

## 2022-07-24 DIAGNOSIS — F39 Unspecified mood [affective] disorder: Secondary | ICD-10-CM

## 2022-07-24 DIAGNOSIS — M542 Cervicalgia: Secondary | ICD-10-CM | POA: Diagnosis not present

## 2022-07-24 DIAGNOSIS — M549 Dorsalgia, unspecified: Secondary | ICD-10-CM | POA: Insufficient documentation

## 2022-07-24 DIAGNOSIS — G8929 Other chronic pain: Secondary | ICD-10-CM

## 2022-07-24 DIAGNOSIS — K219 Gastro-esophageal reflux disease without esophagitis: Secondary | ICD-10-CM

## 2022-07-24 DIAGNOSIS — E781 Pure hyperglyceridemia: Secondary | ICD-10-CM

## 2022-07-24 NOTE — Progress Notes (Signed)
Patient ID: Maureen Love, female    DOB: 12-26-1950, 71 y.o.   MRN: [id number]   Chief Complaint  Patient presents with   Consult    Mammary Hyperplasia: The patient is a 71 y.o. female with a history of mammary hyperplasia for several years.  She has extremely large breasts causing symptoms that include the following: Back pain in the upper and lower back, including neck pain. She pulls or pins her bra straps to provide better lift and relief of the pressure and pain. She notices relief by holding her breast up manually.  Her shoulder straps cause grooves and pain and pressure that requires padding for relief. Pain medication is sometimes required with motrin and tylenol.  Activities that are hindered by enlarged breasts include: exercise and running.  She has tried supportive clothing as well as fitted bras without improvement.  Her breasts are extremely large and fairly symmetric.  She has hyperpigmentation of the inframammary area on both sides.  The sternal to nipple distance on the right is 30 cm and the left is 32 cm.  The IMF distance is 17 cm.  She is 5 feet tall and weighs 176 pounds.  The BMI = 34.4 kg/m.  Preoperative bra size = 38 DD cup.  The patient is interested in a C cup but is willing to go to a B cup.  The estimated excess breast tissue to be removed at the time of surgery = 350-400 grams on the left and 350-400 grams on the right.  Mammogram history: 2021 and was negative.  She is willing to get an updated mammogram.  Family history of breast cancer:  no.  Tobacco use:  no.   The patient expresses the desire to pursue surgical intervention.     Review of Systems  Constitutional: Negative.   HENT: Negative.    Eyes: Negative.   Respiratory: Negative.  Negative for chest tightness.   Cardiovascular: Negative.  Negative for leg swelling.  Gastrointestinal: Negative.   Endocrine: Negative.   Genitourinary: Negative.   Musculoskeletal: Negative.     Past Medical  History:  Diagnosis Date   Abdomen firm on palpation    Acid reflux    Allergy    Anxiety    Arthritis    Asthma    Asthma    Back pain    Chronic headaches    Depression    Eczema    Headache    HTN (hypertension)    Hyperlipidemia    Hypertension    no meds at the moment   Hypothyroidism    Mild asthma    uses dulera   OA (osteoarthritis)    OAB (overactive bladder)    Osteoporosis    SOB (shortness of breath) on exertion    Urge urinary incontinence     Past Surgical History:  Procedure Laterality Date   ABDOMINOPLASTY     BLADDER SUSPENSION  12 years ago   [place]  2011 & 2019   both feet   CARDIOVASCULAR STRESS TEST  12-23-2007   NORMAL NUCLEAR STUDY   CYSTOSCOPY WITH INJECTION N/A 06/25/2013   Procedure: CYSTOSCOPY WITH BOTOX INJECTION;  Surgeon: Reece Packer, MD;  Location: [facility];  Service: Urology;  Laterality: N/A;   CYSTOSCOPY WITH INJECTION N/A 12/14/2014   Procedure: CYSTOSCOPY WITH BOTOX  INJECTION;  Surgeon: Reece Packer, MD;  Location: [facility];  Service: Urology;  Laterality: N/A;   EXCISION LIPOMAS OF  BACK AND RIGHT THIGH  11-28-2011   GYNECOLOGIC CRYOSURGERY  Age 62-25   NASAL SEPTUM SURGERY  2012   NASAL SEPTUM SURGERY  2016   NASAL SINUS SURGERY  1990'S   RHINOPLASTY  1987   TRANSVAGINAL TAPE (TVT) REMOVAL  2006   TUBAL LIGATION  age 34      Current Outpatient Medications:    albuterol (PROVENTIL HFA;VENTOLIN HFA) 108 (90 BASE) MCG/ACT inhaler, Inhale 2 puffs into the lungs every 6 (six) hours as needed for wheezing or shortness of breath., Disp: 1 Inhaler, Rfl: 1   budesonide-formoterol (SYMBICORT) 160-4.5 MCG/ACT inhaler, Inhale 2 puffs into the lungs 2 (two) times daily., Disp: , Rfl:    citalopram (CELEXA) 20 MG tablet, TAKE 1 TABLET BY MOUTH EVERY DAY, Disp: 90 tablet, Rfl: 2   dicyclomine (BENTYL) 10 MG capsule, TAKE 1 CAPSULE BY MOUTH 4 TIMES A DAY, Disp: 360 capsule, Rfl: 1   EPINEPHrine  0.3 mg/0.3 mL IJ SOAJ injection, AS DIRECTED AS NEEDED FOR SYSTEMIC REACTIONS INJECTION 30 DAYS, Disp: , Rfl:    Glucosamine-Chondroit-Vit C-Mn (GLUCOSAMINE 1500 COMPLEX) CAPS, Take 1 capsule by mouth daily. , Disp: , Rfl:    methylPREDNISolone (MEDROL DOSEPAK) 4 MG TBPK tablet, Take as directed, Disp: 21 tablet, Rfl: 0   Omega-3 Fatty Acids (FISH OIL) 1200 MG CAPS, Take 1,000 mg by mouth daily. , Disp: , Rfl:    rosuvastatin (CRESTOR) 5 MG tablet, Every other night before bedtime, Disp: 30 tablet, Rfl: 0   traZODone (DESYREL) 100 MG tablet, 0.5 -1 tabs po q hs, Disp: 30 tablet, Rfl: 0   TURMERIC PO, Take by mouth. Takes 2,000 daily, Disp: , Rfl:    zolpidem (AMBIEN) 5 MG tablet, Take 1 tablet (5 mg total) by mouth at bedtime as needed for sleep., Disp: 30 tablet, Rfl: 0  Current Facility-Administered Medications:    0.9 %  sodium chloride infusion, 500 mL, Intravenous, Once, Thornton Park, MD   Objective:   Vitals:   07/24/22 1315  Pulse: 90  SpO2: 97%    Physical Exam Vitals and nursing note reviewed.  Constitutional:      Appearance: Normal appearance.  HENT:     Head: Normocephalic and atraumatic.  Cardiovascular:     Rate and Rhythm: Normal rate.     Pulses: Normal pulses.  Pulmonary:     Effort: Pulmonary effort is normal.  Abdominal:     General: There is no distension.     Palpations: Abdomen is soft.     Tenderness: There is no abdominal tenderness.  Musculoskeletal:        General: No swelling or deformity.  Skin:    General: Skin is warm.     Coloration: Skin is not jaundiced.  Neurological:     General: No focal deficit present.     Mental Status: She is alert.  Psychiatric:        Mood and Affect: Mood normal.        Behavior: Behavior normal.        Thought Content: Thought content normal.        Judgment: Judgment normal.     Assessment & Plan:  Hypertriglyceridemia  Gastroesophageal reflux disease without esophagitis  Mild intermittent  asthma in adult without complication  Mood disorder (HCC)  Symptomatic mammary hypertrophy  Chronic bilateral thoracic back pain  The procedure the patient selected and that was best for the patient was discussed. The risk were discussed and include but not limited to the  following:  Breast asymmetry, fluid accumulation, firmness of the breast, inability to breast feed, loss of nipple or areola, skin loss, change in skin and nipple sensation, fat necrosis of the breast tissue, bleeding, infection and healing delay.  There are risks of anesthesia and injury to nerves or blood vessels.  Allergic reaction to tape, suture and skin glue are possible.  There will be swelling.  Any of these can lead to the need for revisional surgery.  A breast reduction has potential to interfere with diagnostic procedures in the future.  This procedure is best done when the breast is fully developed.  Changes in the breast will continue to occur over time: pregnancy, weight gain or weigh loss.    Total time: 40 minutes. This includes time spent with the patient during the visit as well as time spent before and after the visit reviewing the chart, documenting the encounter, ordering pertinent studies and literature for the patient.   Physical therapy:  none Mammogram:  will get an update now  The patient is a good candidate for bilateral breast reduction with possible liposuction.  She lives alone so she will need to be kept overnight.  She will also need an updated mammogram prior to surgery.   Pictures were obtained of the patient and placed in the chart with the patient's or guardian's permission.   [place], DO

## 2022-07-25 ENCOUNTER — Encounter: Payer: Self-pay | Admitting: Plastic Surgery

## 2022-07-25 DIAGNOSIS — Z1231 Encounter for screening mammogram for malignant neoplasm of breast: Secondary | ICD-10-CM | POA: Diagnosis not present

## 2022-08-07 DIAGNOSIS — E7849 Other hyperlipidemia: Secondary | ICD-10-CM | POA: Diagnosis not present

## 2022-08-07 DIAGNOSIS — Z0000 Encounter for general adult medical examination without abnormal findings: Secondary | ICD-10-CM | POA: Diagnosis not present

## 2022-08-07 DIAGNOSIS — G43009 Migraine without aura, not intractable, without status migrainosus: Secondary | ICD-10-CM | POA: Diagnosis not present

## 2022-08-07 DIAGNOSIS — K219 Gastro-esophageal reflux disease without esophagitis: Secondary | ICD-10-CM | POA: Diagnosis not present

## 2022-08-07 DIAGNOSIS — F39 Unspecified mood [affective] disorder: Secondary | ICD-10-CM | POA: Diagnosis not present

## 2022-08-07 DIAGNOSIS — Z23 Encounter for immunization: Secondary | ICD-10-CM | POA: Diagnosis not present

## 2022-08-23 ENCOUNTER — Encounter: Payer: Self-pay | Admitting: Plastic Surgery

## 2022-08-24 ENCOUNTER — Telehealth: Payer: Self-pay

## 2022-08-24 NOTE — Telephone Encounter (Signed)
Faxed notes, mmg and photos to [facility]. Sent Mychart message to patient advising pre-authorization process has started.

## 2022-08-27 NOTE — Telephone Encounter (Signed)
10/23 authorization #[id number] cpt [id number] eff-11/1-1/30/24

## 2022-08-30 ENCOUNTER — Telehealth: Payer: Self-pay | Admitting: *Deleted

## 2022-08-30 NOTE — Telephone Encounter (Signed)
MyChart Message sent offering the following surgery date options:   09/26/2022 @ 10:30am 2.   10/10/2022 @ 8:30am 3.   10/25/2022 @ 10:45am   HTA auth: [id number] valid 09/05/22 - 12/04/22

## 2022-09-18 ENCOUNTER — Encounter: Payer: Self-pay | Admitting: Student

## 2022-09-18 ENCOUNTER — Ambulatory Visit (INDEPENDENT_AMBULATORY_CARE_PROVIDER_SITE_OTHER): Payer: PPO | Admitting: Student

## 2022-09-18 ENCOUNTER — Encounter: Payer: PPO | Admitting: Surgical

## 2022-09-18 VITALS — BP 148/80 | HR 94 | Ht 62.0 in | Wt 176.0 lb

## 2022-09-18 DIAGNOSIS — N62 Hypertrophy of breast: Secondary | ICD-10-CM

## 2022-09-18 MED ORDER — ONDANSETRON HCL 4 MG PO TABS: 4.0000 mg | ORAL_TABLET | Freq: Three times a day (TID) | ORAL | 0 refills | Status: DC | PRN

## 2022-09-18 MED ORDER — OXYCODONE HCL 5 MG PO TABS: 5.0000 mg | ORAL_TABLET | Freq: Three times a day (TID) | ORAL | 0 refills | Status: DC | PRN

## 2022-09-18 MED ORDER — DOXYCYCLINE HYCLATE 100 MG PO TABS: 100.0000 mg | ORAL_TABLET | Freq: Two times a day (BID) | ORAL | 0 refills | Status: AC

## 2022-09-18 NOTE — H&P (View-Only) (Signed)
Patient ID: Maureen Love, female    DOB: Aug 29, 1951, 71 y.o.   MRN: [id number]  Chief Complaint  Patient presents with   Pre-op Exam      ICD-10-CM   1. Symptomatic mammary hypertrophy  N62        History of Present Illness: [name] is a 71 y.o.  female  with a history of macromastia.  She presents for preoperative evaluation for upcoming procedure, Bilateral Breast Reduction with liposuction, scheduled for 09/26/2022 with Dr.  Marla Roe  Patient reports she has had several surgeries in the past.  The patient has not had problems with anesthesia.  She denies any personal or family history of breast cancer.  She denies any history of cardiac disease.  She states she does not take any blood thinners.  Patient reports she is a non-smoker.  Patient denies taking any birth control or hormone replacement.  She states that she had one miscarriage when she was 21.  She denies any personal or family history of blood clots or clotting diseases.  She denies any recent surgeries, traumas, infections or hospitalizations.  She denies any history of stroke or heart attack.  Patient reports history of mild asthma.  She denies any history of cancer.  She denies any recent fevers, chills or shortness of breath.  She denies any recent changes in her health.  Patient reports that she is currently a 13 DD cup.  She states that she would like to be a C cup, but is understanding if she will be a B cup.  Summary of Previous Visit: Patient was seen in the clinic for consult by Dr. Marla Roe on 07/24/2022.  At this visit, she reported symptoms including back pain and neck pain.  She reported that activities were hindered by her large breasts such as exercise and running.  Patient was found to have an STN on the right of 30 cm and an STN on the left at 32 cm.  Her BMI was 34.4 kg/m.  Her preoperative bra size was a 38 DD cup.  The patient expresses there is some being a C cup but is willing to be a B cup.   Patient was found to be a good candidate for bilateral breast reduction with possible liposuction.  It was discussed with the patient that she will be kept overnight after surgery.  Patient was instructed to get a mammogram.  Estimated excess breast tissue to be removed at time of surgery: 350-400 grams  Job: Retired  [place] Significant for: Migraine, GERD, asthma  Patient reports she is no longer taking methylprednisolone.    Past Medical History: Allergies: Allergies  Allergen Reactions   Penicillins Swelling   Morphine And Related Itching    Current Medications:  Current Outpatient Medications:    albuterol (PROVENTIL HFA;VENTOLIN HFA) 108 (90 BASE) MCG/ACT inhaler, Inhale 2 puffs into the lungs every 6 (six) hours as needed for wheezing or shortness of breath., Disp: 1 Inhaler, Rfl: 1   budesonide-formoterol (SYMBICORT) 160-4.5 MCG/ACT inhaler, Inhale 2 puffs into the lungs 2 (two) times daily., Disp: , Rfl:    citalopram (CELEXA) 20 MG tablet, TAKE 1 TABLET BY MOUTH EVERY DAY (Patient taking differently: 40 mg.), Disp: 90 tablet, Rfl: 2   dicyclomine (BENTYL) 10 MG capsule, TAKE 1 CAPSULE BY MOUTH 4 TIMES A DAY, Disp: 360 capsule, Rfl: 1   doxycycline (VIBRA-TABS) 100 MG tablet, Take 1 tablet (100 mg total) by mouth 2 (two) times daily for  3 days., Disp: 6 tablet, Rfl: 0   EPINEPHrine 0.3 mg/0.3 mL IJ SOAJ injection, AS DIRECTED AS NEEDED FOR SYSTEMIC REACTIONS INJECTION 30 DAYS, Disp: , Rfl:    Glucosamine-Chondroit-Vit C-Mn (GLUCOSAMINE 1500 COMPLEX) CAPS, Take 1 capsule by mouth daily. , Disp: , Rfl:    methylPREDNISolone (MEDROL DOSEPAK) 4 MG TBPK tablet, Take as directed, Disp: 21 tablet, Rfl: 0   Omega-3 Fatty Acids (FISH OIL) 1200 MG CAPS, Take 1,000 mg by mouth daily. , Disp: , Rfl:    ondansetron (ZOFRAN) 4 MG tablet, Take 1 tablet (4 mg total) by mouth every 8 (eight) hours as needed for up to 20 doses for nausea or vomiting., Disp: 20 tablet, Rfl: 0   oxyCODONE  (ROXICODONE) 5 MG immediate release tablet, Take 1 tablet (5 mg total) by mouth every 8 (eight) hours as needed for up to 20 doses for severe pain., Disp: 20 tablet, Rfl: 0   rosuvastatin (CRESTOR) 5 MG tablet, Every other night before bedtime, Disp: 30 tablet, Rfl: 0   traZODone (DESYREL) 100 MG tablet, 0.5 -1 tabs po q hs, Disp: 30 tablet, Rfl: 0   zolpidem (AMBIEN) 5 MG tablet, Take 1 tablet (5 mg total) by mouth at bedtime as needed for sleep. (Patient taking differently: Take 10 mg by mouth at bedtime as needed for sleep.), Disp: 30 tablet, Rfl: 0   clonazePAM (KLONOPIN) 0.5 MG tablet, Take 0.5 mg by mouth 2 (two) times daily as needed for anxiety., Disp: , Rfl:    pantoprazole (PROTONIX) 40 MG tablet, Take 40 mg by mouth daily., Disp: , Rfl:    SUMAtriptan (IMITREX) 100 MG tablet, Take 100 mg by mouth every 2 (two) hours as needed for migraine. May repeat in 2 hours if headache persists or recurs., Disp: , Rfl:    SUMAtriptan (IMITREX) 20 MG/ACT nasal spray, Place 20 mg into the nose every 2 (two) hours as needed for migraine or headache. May repeat in 2 hours if headache persists or recurs., Disp: , Rfl:    TURMERIC PO, Take by mouth. Takes 2,000 daily, Disp: , Rfl:    Vitamin D, Ergocalciferol, (DRISDOL) 1.25 MG (50000 UNIT) CAPS capsule, Take 50,000 Units by mouth every 7 (seven) days., Disp: , Rfl:   Current Facility-Administered Medications:    0.9 %  sodium chloride infusion, 500 mL, Intravenous, Once, [place], MD  Past Medical Problems: Past Medical History:  Diagnosis Date   Abdomen firm on palpation    Acid reflux    Allergy    Anxiety    Arthritis    Asthma    Asthma    Back pain    Chronic headaches    Depression    Eczema    Headache    HTN (hypertension)    Hyperlipidemia    Hypertension    no meds at the moment   Hypothyroidism    Mild asthma    uses dulera   OA (osteoarthritis)    OAB (overactive bladder)    Osteoporosis    SOB (shortness of  breath) on exertion    Urge urinary incontinence     Past Surgical History: Past Surgical History:  Procedure Laterality Date   ABDOMINOPLASTY     BLADDER SUSPENSION  12 years ago   [place]  2011 & 2019   both feet   CARDIOVASCULAR STRESS TEST  12-23-2007   NORMAL NUCLEAR STUDY   CYSTOSCOPY WITH INJECTION N/A 06/25/2013   Procedure: CYSTOSCOPY WITH BOTOX INJECTION;  Surgeon: Nicki Reaper  Reola Mosher, MD;  Location: [facility];  Service: Urology;  Laterality: N/A;   CYSTOSCOPY WITH INJECTION N/A 12/14/2014   Procedure: CYSTOSCOPY WITH BOTOX  INJECTION;  Surgeon: Reece Packer, MD;  Location: [facility];  Service: Urology;  Laterality: N/A;   EXCISION LIPOMAS OF BACK AND RIGHT THIGH  11-28-2011   GYNECOLOGIC CRYOSURGERY  Age [age]   NASAL SEPTUM SURGERY  2012   NASAL SEPTUM SURGERY  2016   NASAL SINUS SURGERY  1990'S   RHINOPLASTY  1987   TRANSVAGINAL TAPE (TVT) REMOVAL  2006   TUBAL LIGATION  age 46    Social History: Social History   Socioeconomic History   Marital status: Widowed    Spouse name: Simona Huh   Number of children: 1   Years of education: 12   Highest education level: Not on file  Occupational History   Occupation: retired    Comment: etired  Tobacco Use   Smoking status: Never   Smokeless tobacco: Never  Vaping Use   Vaping Use: Never used  Substance and Sexual Activity   Alcohol use: Yes    Comment: OCCASIONAL   Drug use: No   Sexual activity: Not Currently  Other Topics Concern   Not on file  Social History Narrative   Lives with spouse   Caffeine- daily 1 Dr Malachi Bonds, coffee 1 c   Social Determinants of Health   Financial Resource Strain: Not on file  Food Insecurity: Not on file  Transportation Needs: Not on file  Physical Activity: Not on file  Stress: Not on file  Social Connections: Not on file  Intimate Partner Violence: Not on file    Family History: Family History  Problem Relation Age of Onset    Hypertension Mother    Stroke Mother    Migraines Mother    Stomach cancer Mother    Hyperlipidemia Mother    Heart disease Mother    Cancer Mother    Depression Mother    Anxiety disorder Mother    Hypertension Father    Heart attack Father    Stroke Father    Hyperlipidemia Father    Heart disease Father    Thyroid disease Sister    Migraines Sister    Heart attack Son    Migraines Brother    Stroke Maternal Aunt    Stroke Maternal Uncle    Stroke Paternal Aunt    Stroke Paternal Uncle    Breast cancer Neg Hx    Esophageal cancer Neg Hx    Liver disease Neg Hx    Colon cancer Neg Hx    Pancreatic cancer Neg Hx    Rectal cancer Neg Hx     Review of Systems: Denies any fevers, chills or shortness of breath.  She denies any recent changes in her health.  Physical Exam: Vital Signs BP (!) 148/80 (BP Location: Right Arm, Patient Position: Sitting, Cuff Size: Large)   Pulse 94   Ht '5\' 2"'$  (1.575 m)   Wt 176 lb (79.8 kg)   SpO2 98%   BMI 32.19 kg/m   Physical Exam  Constitutional:      General: Not in acute distress.    Appearance: Normal appearance. Not ill-appearing.  HENT:     Head: Normocephalic and atraumatic.  Neck:     Musculoskeletal: Normal range of motion.  Cardiovascular:     Rate and Rhythm: Normal rate Pulmonary:     Effort: Pulmonary effort is normal. No respiratory  distress.  Musculoskeletal: Normal range of motion.  Lower Extremities: Nonswollen bilaterally, no varicose veins noted. Skin:    General: Skin is warm and dry.     Findings: No erythema or rash.  Neurological:      Mental Status: Alert and oriented to person, place, and time. Mental status is at baseline.  Psychiatric:        Mood and Affect: Mood normal.        Behavior: Behavior normal.    Assessment/Plan: The patient is scheduled for bilateral breast reduction with liposuction with Dr. Marla Roe.  Risks, benefits, and alternatives of procedure discussed, questions answered  and consent obtained.    Smoking Status: Non-smoker; Counseling Given?  N/A Last Mammogram: September 2023; Results: Patient reports that her mammogram was negative.  She presented a letter today that she got in the mail stating that her mammogram was normal.  Caprini Score: 5; Risk Factors include: Age, BMI > 25, and length of planned surgery. Recommendation for mechanical prophylaxis. Encourage early ambulation.   Pictures obtained: '@consult'$   Post-op Rx sent to pharmacy: Oxycodone, Zofran, Doxycycline   Patient reports she has a penicillin allergy.  Will prescribe doxycycline instead.  I discussed with the patient that she should hold her sumatriptan, zolpidem, and Klonopin the day of surgery.  I discussed with her that she should hold her supplements, vitamins and ibuprofen 1 week prior to surgery.  Patient expressed understanding.  Patient will plan to stay the night after her surgery.  She discussed this with Dr. Marla Roe at her consult visit.  Patient was provided with the breast reduction and General Surgical Risk consent document and Pain Medication Agreement prior to their appointment.  They had adequate time to read through the risk consent documents and Pain Medication Agreement. We also discussed them in person together during this preop appointment. All of their questions were answered to their satisfaction.  Recommended calling if they have any further questions.  Risk consent form and Pain Medication Agreement to be scanned into patient's chart.  The risk that can be encountered with breast reduction were discussed and include the following but not limited to these:  Breast asymmetry, fluid accumulation, firmness of the breast, inability to breast feed, loss of nipple or areola, skin loss, decrease or no nipple sensation, fat necrosis of the breast tissue, bleeding, infection, healing delay.  There are risks of anesthesia, changes to skin sensation and injury to nerves or blood  vessels.  The muscle can be temporarily or permanently injured.  You may have an allergic reaction to tape, suture, glue, blood products which can result in skin discoloration, swelling, pain, skin lesions, poor healing.  Any of these can lead to the need for revisonal surgery or stage procedures.  A reduction has potential to interfere with diagnostic procedures.  Nipple or breast piercing can increase risks of infection.  This procedure is best done when the breast is fully developed.  Changes in the breast will continue to occur over time.  Pregnancy can alter the outcomes of previous breast reduction surgery, weight gain and weigh loss can also effect the long term appearance.   Patient's vital signs for today's encounter are as follows: BP (!) 148/80 (BP Location: Right Arm, Patient Position: Sitting, Cuff Size: Large)   Pulse 94   Ht '5\' 2"'$  (1.575 m)   Wt 176 lb (79.8 kg)   SpO2 98%   BMI 32.19 kg/m   Patient is encouraged to follow-up with their primary care provider  for evaluation and management of their mildly elevated blood pressure.    Electronically signed by: Clance Boll, PA-C 09/18/2022 3:57 PM

## 2022-09-18 NOTE — Progress Notes (Signed)
Patient ID: Maureen Love, female    DOB: 1951-09-21, 71 y.o.   MRN: [id number]  Chief Complaint  Patient presents with   Pre-op Exam      ICD-10-CM   1. Symptomatic mammary hypertrophy  N62        History of Present Illness: Maureen Love is a 71 y.o.  female  with a history of macromastia.  She presents for preoperative evaluation for upcoming procedure, Bilateral Breast Reduction with liposuction, scheduled for 09/26/2022 with Dr.  Marla Roe  Patient reports she has had several surgeries in the past.  The patient has not had problems with anesthesia.  She denies any personal or family history of breast cancer.  She denies any history of cardiac disease.  She states she does not take any blood thinners.  Patient reports she is a non-smoker.  Patient denies taking any birth control or hormone replacement.  She states that she had one miscarriage when she was 82.  She denies any personal or family history of blood clots or clotting diseases.  She denies any recent surgeries, traumas, infections or hospitalizations.  She denies any history of stroke or heart attack.  Patient reports history of mild asthma.  She denies any history of cancer.  She denies any recent fevers, chills or shortness of breath.  She denies any recent changes in her health.  Patient reports that she is currently a 78 DD cup.  She states that she would like to be a C cup, but is understanding if she will be a B cup.  Summary of Previous Visit: Patient was seen in the clinic for consult by Dr. Marla Roe on 07/24/2022.  At this visit, she reported symptoms including back pain and neck pain.  She reported that activities were hindered by her large breasts such as exercise and running.  Patient was found to have an STN on the right of 30 cm and an STN on the left at 32 cm.  Her BMI was 34.4 kg/m.  Her preoperative bra size was a 38 DD cup.  The patient expresses there is some being a C cup but is willing to be a B cup.   Patient was found to be a good candidate for bilateral breast reduction with possible liposuction.  It was discussed with the patient that she will be kept overnight after surgery.  Patient was instructed to get a mammogram.  Estimated excess breast tissue to be removed at time of surgery: 350-400 grams  Job: Retired  [place] Significant for: Migraine, GERD, asthma  Patient reports she is no longer taking methylprednisolone.    Past Medical History: Allergies: Allergies  Allergen Reactions   Penicillins Swelling   Morphine And Related Itching    Current Medications:  Current Outpatient Medications:    albuterol (PROVENTIL HFA;VENTOLIN HFA) 108 (90 BASE) MCG/ACT inhaler, Inhale 2 puffs into the lungs every 6 (six) hours as needed for wheezing or shortness of breath., Disp: 1 Inhaler, Rfl: 1   budesonide-formoterol (SYMBICORT) 160-4.5 MCG/ACT inhaler, Inhale 2 puffs into the lungs 2 (two) times daily., Disp: , Rfl:    citalopram (CELEXA) 20 MG tablet, TAKE 1 TABLET BY MOUTH EVERY DAY (Patient taking differently: 40 mg.), Disp: 90 tablet, Rfl: 2   dicyclomine (BENTYL) 10 MG capsule, TAKE 1 CAPSULE BY MOUTH 4 TIMES A DAY, Disp: 360 capsule, Rfl: 1   doxycycline (VIBRA-TABS) 100 MG tablet, Take 1 tablet (100 mg total) by mouth 2 (two) times daily for  3 days., Disp: 6 tablet, Rfl: 0   EPINEPHrine 0.3 mg/0.3 mL IJ SOAJ injection, AS DIRECTED AS NEEDED FOR SYSTEMIC REACTIONS INJECTION 30 DAYS, Disp: , Rfl:    Glucosamine-Chondroit-Vit C-Mn (GLUCOSAMINE 1500 COMPLEX) CAPS, Take 1 capsule by mouth daily. , Disp: , Rfl:    methylPREDNISolone (MEDROL DOSEPAK) 4 MG TBPK tablet, Take as directed, Disp: 21 tablet, Rfl: 0   Omega-3 Fatty Acids (FISH OIL) 1200 MG CAPS, Take 1,000 mg by mouth daily. , Disp: , Rfl:    ondansetron (ZOFRAN) 4 MG tablet, Take 1 tablet (4 mg total) by mouth every 8 (eight) hours as needed for up to 20 doses for nausea or vomiting., Disp: 20 tablet, Rfl: 0   oxyCODONE  (ROXICODONE) 5 MG immediate release tablet, Take 1 tablet (5 mg total) by mouth every 8 (eight) hours as needed for up to 20 doses for severe pain., Disp: 20 tablet, Rfl: 0   rosuvastatin (CRESTOR) 5 MG tablet, Every other night before bedtime, Disp: 30 tablet, Rfl: 0   traZODone (DESYREL) 100 MG tablet, 0.5 -1 tabs po q hs, Disp: 30 tablet, Rfl: 0   zolpidem (AMBIEN) 5 MG tablet, Take 1 tablet (5 mg total) by mouth at bedtime as needed for sleep. (Patient taking differently: Take 10 mg by mouth at bedtime as needed for sleep.), Disp: 30 tablet, Rfl: 0   clonazePAM (KLONOPIN) 0.5 MG tablet, Take 0.5 mg by mouth 2 (two) times daily as needed for anxiety., Disp: , Rfl:    pantoprazole (PROTONIX) 40 MG tablet, Take 40 mg by mouth daily., Disp: , Rfl:    SUMAtriptan (IMITREX) 100 MG tablet, Take 100 mg by mouth every 2 (two) hours as needed for migraine. May repeat in 2 hours if headache persists or recurs., Disp: , Rfl:    SUMAtriptan (IMITREX) 20 MG/ACT nasal spray, Place 20 mg into the nose every 2 (two) hours as needed for migraine or headache. May repeat in 2 hours if headache persists or recurs., Disp: , Rfl:    TURMERIC PO, Take by mouth. Takes 2,000 daily, Disp: , Rfl:    Vitamin D, Ergocalciferol, (DRISDOL) 1.25 MG (50000 UNIT) CAPS capsule, Take 50,000 Units by mouth every 7 (seven) days., Disp: , Rfl:   Current Facility-Administered Medications:    0.9 %  sodium chloride infusion, 500 mL, Intravenous, Once, [place], MD  Past Medical Problems: Past Medical History:  Diagnosis Date   Abdomen firm on palpation    Acid reflux    Allergy    Anxiety    Arthritis    Asthma    Asthma    Back pain    Chronic headaches    Depression    Eczema    Headache    HTN (hypertension)    Hyperlipidemia    Hypertension    no meds at the moment   Hypothyroidism    Mild asthma    uses dulera   OA (osteoarthritis)    OAB (overactive bladder)    Osteoporosis    SOB (shortness of  breath) on exertion    Urge urinary incontinence     Past Surgical History: Past Surgical History:  Procedure Laterality Date   ABDOMINOPLASTY     BLADDER SUSPENSION  12 years ago   [place]  2011 & 2019   both feet   CARDIOVASCULAR STRESS TEST  12-23-2007   NORMAL NUCLEAR STUDY   CYSTOSCOPY WITH INJECTION N/A 06/25/2013   Procedure: CYSTOSCOPY WITH BOTOX INJECTION;  Surgeon: Nicki Reaper  Reola Mosher, MD;  Location: [facility] [place];  Service: Urology;  Laterality: N/A;   CYSTOSCOPY WITH INJECTION N/A 12/14/2014   Procedure: CYSTOSCOPY WITH BOTOX  INJECTION;  Surgeon: Reece Packer, MD;  Location: [place];  Service: Urology;  Laterality: N/A;   EXCISION LIPOMAS OF BACK AND RIGHT THIGH  11-28-2011   GYNECOLOGIC CRYOSURGERY  Age 47-25   NASAL SEPTUM SURGERY  2012   NASAL SEPTUM SURGERY  2016   NASAL SINUS SURGERY  1990'S   RHINOPLASTY  1987   TRANSVAGINAL TAPE (TVT) REMOVAL  2006   TUBAL LIGATION  age 89    Social History: Social History   Socioeconomic History   Marital status: Widowed    Spouse name: Simona Huh   Number of children: 1   Years of education: 12   Highest education level: Not on file  Occupational History   Occupation: retired    Comment: etired  Tobacco Use   Smoking status: Never   Smokeless tobacco: Never  Vaping Use   Vaping Use: Never used  Substance and Sexual Activity   Alcohol use: Yes    Comment: OCCASIONAL   Drug use: No   Sexual activity: Not Currently  Other Topics Concern   Not on file  Social History Narrative   Lives with spouse   Caffeine- daily 1 Dr Malachi Bonds, coffee 1 c   Social Determinants of Health   Financial Resource Strain: Not on file  Food Insecurity: Not on file  Transportation Needs: Not on file  Physical Activity: Not on file  Stress: Not on file  Social Connections: Not on file  Intimate Partner Violence: Not on file    Family History: Family History  Problem Relation Age of Onset    Hypertension Mother    Stroke Mother    Migraines Mother    Stomach cancer Mother    Hyperlipidemia Mother    Heart disease Mother    Cancer Mother    Depression Mother    Anxiety disorder Mother    Hypertension Father    Heart attack Father    Stroke Father    Hyperlipidemia Father    Heart disease Father    Thyroid disease Sister    Migraines Sister    Heart attack Son    Migraines Brother    Stroke Maternal Aunt    Stroke Maternal Uncle    Stroke Paternal Aunt    Stroke Paternal Uncle    Breast cancer Neg Hx    Esophageal cancer Neg Hx    Liver disease Neg Hx    Colon cancer Neg Hx    Pancreatic cancer Neg Hx    Rectal cancer Neg Hx     Review of Systems: Denies any fevers, chills or shortness of breath.  She denies any recent changes in her health.  Physical Exam: Vital Signs BP (!) 148/80 (BP Location: Right Arm, Patient Position: Sitting, Cuff Size: Large)   Pulse 94   Ht '5\' 2"'$  (1.575 m)   Wt 176 lb (79.8 kg)   SpO2 98%   BMI 32.19 kg/m   Physical Exam  Constitutional:      General: Not in acute distress.    Appearance: Normal appearance. Not ill-appearing.  HENT:     Head: Normocephalic and atraumatic.  Neck:     Musculoskeletal: Normal range of motion.  Cardiovascular:     Rate and Rhythm: Normal rate Pulmonary:     Effort: Pulmonary effort is normal. No respiratory  distress.  Musculoskeletal: Normal range of motion.  Lower Extremities: Nonswollen bilaterally, no varicose veins noted. Skin:    General: Skin is warm and dry.     Findings: No erythema or rash.  Neurological:      Mental Status: Alert and oriented to person, place, and time. Mental status is at baseline.  Psychiatric:        Mood and Affect: Mood normal.        Behavior: Behavior normal.    Assessment/Plan: The patient is scheduled for bilateral breast reduction with liposuction with Dr. Marla Roe.  Risks, benefits, and alternatives of procedure discussed, questions answered  and consent obtained.    Smoking Status: Non-smoker; Counseling Given?  N/A Last Mammogram: September 2023; Results: Patient reports that her mammogram was negative.  She presented a letter today that she got in the mail stating that her mammogram was normal.  Caprini Score: 5; Risk Factors include: Age, BMI > 25, and length of planned surgery. Recommendation for mechanical prophylaxis. Encourage early ambulation.   Pictures obtained: '@consult'$   Post-op Rx sent to pharmacy: Oxycodone, Zofran, Doxycycline   Patient reports she has a penicillin allergy.  Will prescribe doxycycline instead.  I discussed with the patient that she should hold her sumatriptan, zolpidem, and Klonopin the day of surgery.  I discussed with her that she should hold her supplements, vitamins and ibuprofen 1 week prior to surgery.  Patient expressed understanding.  Patient will plan to stay the night after her surgery.  She discussed this with Dr. Marla Roe at her consult visit.  Patient was provided with the breast reduction and General Surgical Risk consent document and Pain Medication Agreement prior to their appointment.  They had adequate time to read through the risk consent documents and Pain Medication Agreement. We also discussed them in person together during this preop appointment. All of their questions were answered to their satisfaction.  Recommended calling if they have any further questions.  Risk consent form and Pain Medication Agreement to be scanned into patient's chart.  The risk that can be encountered with breast reduction were discussed and include the following but not limited to these:  Breast asymmetry, fluid accumulation, firmness of the breast, inability to breast feed, loss of nipple or areola, skin loss, decrease or no nipple sensation, fat necrosis of the breast tissue, bleeding, infection, healing delay.  There are risks of anesthesia, changes to skin sensation and injury to nerves or blood  vessels.  The muscle can be temporarily or permanently injured.  You may have an allergic reaction to tape, suture, glue, blood products which can result in skin discoloration, swelling, pain, skin lesions, poor healing.  Any of these can lead to the need for revisonal surgery or stage procedures.  A reduction has potential to interfere with diagnostic procedures.  Nipple or breast piercing can increase risks of infection.  This procedure is best done when the breast is fully developed.  Changes in the breast will continue to occur over time.  Pregnancy can alter the outcomes of previous breast reduction surgery, weight gain and weigh loss can also effect the long term appearance.   Patient's vital signs for today's encounter are as follows: BP (!) 148/80 (BP Location: Right Arm, Patient Position: Sitting, Cuff Size: Large)   Pulse 94   Ht '5\' 2"'$  (1.575 m)   Wt 176 lb (79.8 kg)   SpO2 98%   BMI 32.19 kg/m   Patient is encouraged to follow-up with their primary care provider  for evaluation and management of their mildly elevated blood pressure.    Electronically signed by: Clance Boll, PA-C 09/18/2022 3:57 PM

## 2022-09-19 ENCOUNTER — Other Ambulatory Visit: Payer: Self-pay

## 2022-09-19 ENCOUNTER — Encounter (HOSPITAL_BASED_OUTPATIENT_CLINIC_OR_DEPARTMENT_OTHER): Payer: Self-pay | Admitting: Plastic Surgery

## 2022-09-25 DIAGNOSIS — D23111 Other benign neoplasm of skin of right upper eyelid, including canthus: Secondary | ICD-10-CM | POA: Diagnosis not present

## 2022-09-25 DIAGNOSIS — D485 Neoplasm of uncertain behavior of skin: Secondary | ICD-10-CM | POA: Diagnosis not present

## 2022-09-25 DIAGNOSIS — D22111 Melanocytic nevi of right upper eyelid, including canthus: Secondary | ICD-10-CM | POA: Diagnosis not present

## 2022-09-26 ENCOUNTER — Other Ambulatory Visit: Payer: Self-pay

## 2022-09-26 ENCOUNTER — Ambulatory Visit (HOSPITAL_BASED_OUTPATIENT_CLINIC_OR_DEPARTMENT_OTHER): Payer: PPO | Admitting: Anesthesiology

## 2022-09-26 ENCOUNTER — Encounter (HOSPITAL_BASED_OUTPATIENT_CLINIC_OR_DEPARTMENT_OTHER): Payer: Self-pay | Admitting: Plastic Surgery

## 2022-09-26 ENCOUNTER — Ambulatory Visit (HOSPITAL_BASED_OUTPATIENT_CLINIC_OR_DEPARTMENT_OTHER)
Admission: RE | Admit: 2022-09-26 | Discharge: 2022-09-26 | Disposition: A | Discharge status: Home / Self Care | Payer: PPO | Attending: Plastic Surgery | Admitting: Plastic Surgery

## 2022-09-26 ENCOUNTER — Encounter (HOSPITAL_BASED_OUTPATIENT_CLINIC_OR_DEPARTMENT_OTHER)
Admission: RE | Disposition: A | Discharge status: Home / Self Care | Payer: Self-pay | Source: Home / Self Care | Attending: Plastic Surgery

## 2022-09-26 ENCOUNTER — Telehealth: Payer: Self-pay | Admitting: Plastic Surgery

## 2022-09-26 DIAGNOSIS — M549 Dorsalgia, unspecified: Secondary | ICD-10-CM | POA: Diagnosis not present

## 2022-09-26 DIAGNOSIS — N62 Hypertrophy of breast: Secondary | ICD-10-CM | POA: Diagnosis not present

## 2022-09-26 DIAGNOSIS — E669 Obesity, unspecified: Secondary | ICD-10-CM | POA: Insufficient documentation

## 2022-09-26 DIAGNOSIS — Z01818 Encounter for other preprocedural examination: Secondary | ICD-10-CM

## 2022-09-26 DIAGNOSIS — Z6834 Body mass index (BMI) 34.0-34.9, adult: Secondary | ICD-10-CM | POA: Diagnosis not present

## 2022-09-26 DIAGNOSIS — M542 Cervicalgia: Secondary | ICD-10-CM

## 2022-09-26 DIAGNOSIS — F418 Other specified anxiety disorders: Secondary | ICD-10-CM | POA: Insufficient documentation

## 2022-09-26 HISTORY — PX: BREAST REDUCTION SURGERY: SHX8

## 2022-09-26 SURGERY — BREAST REDUCTION WITH LIPOSUCTION
Anesthesia: General | Site: Breast | Laterality: Bilateral

## 2022-09-26 MED ORDER — ACETAMINOPHEN 500 MG PO TABS
ORAL_TABLET | ORAL | Status: AC
  Filled 2022-09-26: qty 2

## 2022-09-26 MED ORDER — FENTANYL CITRATE (PF) 100 MCG/2ML IJ SOLN: 25.0000 ug | INTRAMUSCULAR | Status: DC | PRN

## 2022-09-26 MED ORDER — ACETAMINOPHEN 325 MG PO TABS: 650.0000 mg | ORAL_TABLET | ORAL | Status: DC | PRN

## 2022-09-26 MED ORDER — FENTANYL CITRATE (PF) 100 MCG/2ML IJ SOLN
INTRAMUSCULAR | Status: DC | PRN
  Administered 2022-09-26: 25 ug via INTRAVENOUS
  Administered 2022-09-26 (×2): 50 ug via INTRAVENOUS

## 2022-09-26 MED ORDER — PROPOFOL 10 MG/ML IV BOLUS
INTRAVENOUS | Status: DC | PRN
  Administered 2022-09-26: 150 mg via INTRAVENOUS

## 2022-09-26 MED ORDER — EPHEDRINE 5 MG/ML INJ
INTRAVENOUS | Status: AC
  Filled 2022-09-26: qty 5

## 2022-09-26 MED ORDER — SUCCINYLCHOLINE CHLORIDE 200 MG/10ML IV SOSY
PREFILLED_SYRINGE | INTRAVENOUS | Status: AC
  Filled 2022-09-26: qty 10

## 2022-09-26 MED ORDER — FENTANYL CITRATE (PF) 100 MCG/2ML IJ SOLN
INTRAMUSCULAR | Status: AC
  Filled 2022-09-26: qty 2

## 2022-09-26 MED ORDER — ATROPINE SULFATE 0.4 MG/ML IV SOLN
INTRAVENOUS | Status: AC
  Filled 2022-09-26: qty 1

## 2022-09-26 MED ORDER — ONDANSETRON HCL 4 MG/2ML IJ SOLN
INTRAMUSCULAR | Status: AC
  Filled 2022-09-26: qty 2

## 2022-09-26 MED ORDER — DEXAMETHASONE SODIUM PHOSPHATE 4 MG/ML IJ SOLN
INTRAMUSCULAR | Status: DC | PRN
  Administered 2022-09-26: 5 mg via INTRAVENOUS

## 2022-09-26 MED ORDER — OXYCODONE HCL 5 MG PO TABS
5.0000 mg | ORAL_TABLET | Freq: Once | ORAL | Status: AC | PRN
  Administered 2022-09-26: 5 mg via ORAL

## 2022-09-26 MED ORDER — ONDANSETRON HCL 4 MG/2ML IJ SOLN
INTRAMUSCULAR | Status: DC | PRN
  Administered 2022-09-26: 4 mg via INTRAVENOUS

## 2022-09-26 MED ORDER — DEXAMETHASONE SODIUM PHOSPHATE 10 MG/ML IJ SOLN
INTRAMUSCULAR | Status: AC
  Filled 2022-09-26: qty 1

## 2022-09-26 MED ORDER — SODIUM CHLORIDE 0.9% FLUSH: 3.0000 mL | INTRAVENOUS | Status: DC | PRN

## 2022-09-26 MED ORDER — SODIUM CHLORIDE 0.9 % IV SOLN: 250.0000 mL | INTRAVENOUS | Status: DC | PRN

## 2022-09-26 MED ORDER — SODIUM CHLORIDE 0.9% FLUSH: 3.0000 mL | Freq: Two times a day (BID) | INTRAVENOUS | Status: DC

## 2022-09-26 MED ORDER — MIDAZOLAM HCL 5 MG/5ML IJ SOLN
INTRAMUSCULAR | Status: DC | PRN
  Administered 2022-09-26: 2 mg via INTRAVENOUS

## 2022-09-26 MED ORDER — LIDOCAINE HCL 1 % IJ SOLN
INTRAVENOUS | Status: DC | PRN
  Administered 2022-09-26: 300 mL

## 2022-09-26 MED ORDER — PROMETHAZINE HCL 25 MG/ML IJ SOLN: 6.2500 mg | INTRAMUSCULAR | Status: DC | PRN

## 2022-09-26 MED ORDER — PHENYLEPHRINE 80 MCG/ML (10ML) SYRINGE FOR IV PUSH (FOR BLOOD PRESSURE SUPPORT)
PREFILLED_SYRINGE | INTRAVENOUS | Status: AC
  Filled 2022-09-26: qty 10

## 2022-09-26 MED ORDER — ACETAMINOPHEN 500 MG PO TABS
1000.0000 mg | ORAL_TABLET | Freq: Once | ORAL | Status: AC
  Administered 2022-09-26: 1000 mg via ORAL

## 2022-09-26 MED ORDER — OXYCODONE HCL 5 MG PO TABS
ORAL_TABLET | ORAL | Status: AC
  Filled 2022-09-26: qty 1

## 2022-09-26 MED ORDER — LIDOCAINE-EPINEPHRINE 1 %-1:100000 IJ SOLN
INTRAMUSCULAR | Status: DC | PRN
  Administered 2022-09-26: 50 mL via INTRAMUSCULAR

## 2022-09-26 MED ORDER — PHENYLEPHRINE 80 MCG/ML (10ML) SYRINGE FOR IV PUSH (FOR BLOOD PRESSURE SUPPORT)
PREFILLED_SYRINGE | INTRAVENOUS | Status: DC | PRN
  Administered 2022-09-26: 160 ug via INTRAVENOUS
  Administered 2022-09-26: 120 ug via INTRAVENOUS

## 2022-09-26 MED ORDER — LACTATED RINGERS IV SOLN: INTRAVENOUS | Status: DC

## 2022-09-26 MED ORDER — CIPROFLOXACIN IN D5W 400 MG/200ML IV SOLN
400.0000 mg | INTRAVENOUS | Status: AC
  Administered 2022-09-26 (×2): 400 mg via INTRAVENOUS

## 2022-09-26 MED ORDER — CELECOXIB 200 MG PO CAPS
200.0000 mg | ORAL_CAPSULE | Freq: Once | ORAL | Status: AC
  Administered 2022-09-26: 200 mg via ORAL

## 2022-09-26 MED ORDER — ACETAMINOPHEN 325 MG RE SUPP: 650.0000 mg | RECTAL | Status: DC | PRN

## 2022-09-26 MED ORDER — LIDOCAINE 2% (20 MG/ML) 5 ML SYRINGE
INTRAMUSCULAR | Status: AC
  Filled 2022-09-26: qty 5

## 2022-09-26 MED ORDER — CIPROFLOXACIN IN D5W 400 MG/200ML IV SOLN
INTRAVENOUS | Status: AC
  Filled 2022-09-26: qty 200

## 2022-09-26 MED ORDER — MIDAZOLAM HCL 2 MG/2ML IJ SOLN
INTRAMUSCULAR | Status: AC
  Filled 2022-09-26: qty 2

## 2022-09-26 MED ORDER — LIDOCAINE HCL (PF) 1 % IJ SOLN
INTRAMUSCULAR | Status: AC
  Filled 2022-09-26: qty 60

## 2022-09-26 MED ORDER — OXYCODONE HCL 5 MG PO TABS: 5.0000 mg | ORAL_TABLET | ORAL | Status: DC | PRN

## 2022-09-26 MED ORDER — OXYCODONE HCL 5 MG/5ML PO SOLN: 5.0000 mg | Freq: Once | ORAL | Status: AC | PRN

## 2022-09-26 MED ORDER — LIDOCAINE HCL (CARDIAC) PF 100 MG/5ML IV SOSY
PREFILLED_SYRINGE | INTRAVENOUS | Status: DC | PRN
  Administered 2022-09-26: 60 mg via INTRAVENOUS

## 2022-09-26 MED ORDER — CELECOXIB 200 MG PO CAPS
ORAL_CAPSULE | ORAL | Status: AC
  Filled 2022-09-26: qty 1

## 2022-09-26 MED ORDER — CHLORHEXIDINE GLUCONATE CLOTH 2 % EX PADS: 6.0000 | MEDICATED_PAD | Freq: Once | CUTANEOUS | Status: DC

## 2022-09-26 MED ORDER — AMISULPRIDE (ANTIEMETIC) 5 MG/2ML IV SOLN: 10.0000 mg | Freq: Once | INTRAVENOUS | Status: DC | PRN

## 2022-09-26 MED ORDER — EPINEPHRINE PF 1 MG/ML IJ SOLN
INTRAMUSCULAR | Status: AC
  Filled 2022-09-26: qty 1

## 2022-09-26 SURGICAL SUPPLY — 66 items
ADH SKN CLS APL DERMABOND .7 (GAUZE/BANDAGES/DRESSINGS) ×2
AGENT HMST PWDR BTL CLGN 5GM (Miscellaneous) ×1 IMPLANT
BAG DECANTER FOR FLEXI CONT (MISCELLANEOUS) ×1 IMPLANT
BINDER BREAST LRG (GAUZE/BANDAGES/DRESSINGS) IMPLANT
BINDER BREAST MEDIUM (GAUZE/BANDAGES/DRESSINGS) IMPLANT
BINDER BREAST XLRG (GAUZE/BANDAGES/DRESSINGS) IMPLANT
BINDER BREAST XXLRG (GAUZE/BANDAGES/DRESSINGS) IMPLANT
BIOPATCH RED 1 DISK 7.0 (GAUZE/BANDAGES/DRESSINGS) IMPLANT
BLADE HEX COATED 2.75 (ELECTRODE) ×1 IMPLANT
BLADE KNIFE PERSONA 10 (BLADE) ×2 IMPLANT
BLADE SURG 15 STRL LF DISP TIS (BLADE) IMPLANT
BLADE SURG 15 STRL SS (BLADE)
CANISTER SUCT 1200ML W/VALVE (MISCELLANEOUS) ×1 IMPLANT
COLLAGEN CELLERATERX 5 GRAM (Miscellaneous) IMPLANT
COVER BACK TABLE 60X90IN (DRAPES) ×1 IMPLANT
COVER MAYO STAND STRL (DRAPES) ×1 IMPLANT
DERMABOND ADVANCED .7 DNX12 (GAUZE/BANDAGES/DRESSINGS) ×2 IMPLANT
DRAIN CHANNEL 19F RND (DRAIN) IMPLANT
DRAPE LAPAROSCOPIC ABDOMINAL (DRAPES) ×1 IMPLANT
DRSG MEPILEX POST OP 4X8 (GAUZE/BANDAGES/DRESSINGS) IMPLANT
DRSG OPSITE POSTOP 4X12 (GAUZE/BANDAGES/DRESSINGS) IMPLANT
DRSG OPSITE POSTOP 4X6 (GAUZE/BANDAGES/DRESSINGS) ×1 IMPLANT
ELECT BLADE 4.0 EZ CLEAN MEGAD (MISCELLANEOUS)
ELECT REM PT RETURN 9FT ADLT (ELECTROSURGICAL) ×1
ELECTRODE BLDE 4.0 EZ CLN MEGD (MISCELLANEOUS) IMPLANT
ELECTRODE REM PT RTRN 9FT ADLT (ELECTROSURGICAL) ×1 IMPLANT
EVACUATOR SILICONE 100CC (DRAIN) IMPLANT
GAUZE PAD ABD 8X10 STRL (GAUZE/BANDAGES/DRESSINGS) ×2 IMPLANT
GLOVE BIO SURGEON STRL SZ 6.5 (GLOVE) ×3 IMPLANT
GLOVE BIO SURGEON STRL SZ7.5 (GLOVE) ×1 IMPLANT
GOWN STRL REUS W/ TWL LRG LVL3 (GOWN DISPOSABLE) ×2 IMPLANT
GOWN STRL REUS W/ TWL XL LVL3 (GOWN DISPOSABLE) ×1 IMPLANT
GOWN STRL REUS W/TWL LRG LVL3 (GOWN DISPOSABLE) ×2
GOWN STRL REUS W/TWL XL LVL3 (GOWN DISPOSABLE) ×1
NDL FILTER BLUNT 18X1 1/2 (NEEDLE) IMPLANT
NDL HYPO 25X1 1.5 SAFETY (NEEDLE) ×1 IMPLANT
NEEDLE FILTER BLUNT 18X1 1/2 (NEEDLE) IMPLANT
NEEDLE HYPO 25X1 1.5 SAFETY (NEEDLE) ×1 IMPLANT
NS IRRIG 1000ML POUR BTL (IV SOLUTION) ×1 IMPLANT
PACK BASIN DAY SURGERY FS (CUSTOM PROCEDURE TRAY) ×1 IMPLANT
PAD ALCOHOL SWAB (MISCELLANEOUS) IMPLANT
PAD FOAM SILICONE BACKED (GAUZE/BANDAGES/DRESSINGS) IMPLANT
PENCIL SMOKE EVACUATOR (MISCELLANEOUS) ×1 IMPLANT
PIN SAFETY STERILE (MISCELLANEOUS) IMPLANT
SLEEVE SCD COMPRESS KNEE MED (STOCKING) ×1 IMPLANT
SPIKE FLUID TRANSFER (MISCELLANEOUS) IMPLANT
SPONGE T-LAP 18X18 ~~LOC~~+RFID (SPONGE) ×2 IMPLANT
STRIP SUTURE WOUND CLOSURE 1/2 (MISCELLANEOUS) ×2 IMPLANT
SUT MNCRL AB 4-0 PS2 18 (SUTURE) ×4 IMPLANT
SUT MON AB 3-0 SH 27 (SUTURE) ×4
SUT MON AB 3-0 SH27 (SUTURE) ×4 IMPLANT
SUT MON AB 5-0 PS2 18 (SUTURE) IMPLANT
SUT PDS 3-0 CT2 (SUTURE) ×5
SUT PDS II 3-0 CT2 27 ABS (SUTURE) ×4 IMPLANT
SUT SILK 3 0 PS 1 (SUTURE) IMPLANT
SYR 50ML LL SCALE MARK (SYRINGE) IMPLANT
SYR BULB IRRIG 60ML STRL (SYRINGE) ×1 IMPLANT
SYR CONTROL 10ML LL (SYRINGE) ×1 IMPLANT
TAPE MEASURE VINYL STERILE (MISCELLANEOUS) IMPLANT
TOWEL GREEN STERILE FF (TOWEL DISPOSABLE) ×2 IMPLANT
TRAY DSU PREP LF (CUSTOM PROCEDURE TRAY) ×1 IMPLANT
TUBE CONNECTING 20X1/4 (TUBING) ×1 IMPLANT
TUBING INFILTRATION IT-10001 (TUBING) IMPLANT
TUBING SET GRADUATE ASPIR 12FT (MISCELLANEOUS) IMPLANT
UNDERPAD 30X36 HEAVY ABSORB (UNDERPADS AND DIAPERS) ×2 IMPLANT
YANKAUER SUCT BULB TIP NO VENT (SUCTIONS) ×1 IMPLANT

## 2022-09-26 NOTE — Op Note (Signed)
Breast Reduction Op note:    DATE OF PROCEDURE: 09/26/2022  LOCATION: [facility]  SURGEON: Lyndee Leo Sanger Reanne Nellums, DO  ASSISTANT: Roetta Sessions, PA  PREOPERATIVE DIAGNOSIS 1. Macromastia 2. Neck Pain 3. Back Pain  POSTOPERATIVE DIAGNOSIS 1. Macromastia 2. Neck Pain 3. Back Pain  PROCEDURES 1. Bilateral breast reduction.  Right reduction 870 g, Left reduction 423 g  COMPLICATIONS: None.  DRAINS: none  INDICATIONS FOR PROCEDURE Maureen Love is a 71 y.o. year-old female born on 01/01/1951,with a history of symptomatic macromastia with concominant back pain, neck pain, shoulder grooving from her bra.   MRN: [id number]  CONSENT Informed consent was obtained directly from the patient. The risks, benefits and alternatives were fully discussed. Specific risks including but not limited to bleeding, infection, hematoma, seroma, scarring, pain, nipple necrosis, asymmetry, poor cosmetic results, and need for further surgery were discussed. The patient had ample opportunity to have her questions answered to her satisfaction.  DESCRIPTION OF PROCEDURE  Patient was brought into the operating room and placed in a supine position.  SCDs were placed and appropriate padding was performed.  Antibiotics were given. The patient underwent general anesthesia and the chest was prepped and draped in a sterile fashion.  A timeout was performed and all information was confirmed to be correct. Tumescent was placed in the lateral breast.  Liposuction was done for improved contour of each breast laterally.   Right side: Preoperative markings were confirmed.  Incision lines were injected with local with epinephrine.  After waiting for vasoconstriction, the marked lines were incised.  A Wise-pattern superomedial breast reduction was performed by de-epithelializing the pedicle, using bovie to create the superomedial pedicle, and removing breast tissue from the superior, lateral, and  inferior portions of the breast.  Care was taken to not undermine the breast pedicle. Hemostasis was achieved.  The nipple was gently rotated into position and the soft tissue closed with 4-0 Monocryl.  Cellerate was placed in the breast pocket.  The pocket was irrigated and hemostasis confirmed.  The deep tissues were approximated with 3-0 PDS and 3-0 Monocryl sutures and the skin was closed with deep dermal and subcuticular 4-0 Monocryl sutures.  The nipple and skin flaps had good capillary refill at the end of the procedure.    Left side: Preoperative markings were confirmed.  Incision lines were injected with local with epinephrine.  After waiting for vasoconstriction, the marked lines were incised.  A Wise-pattern superomedial breast reduction was performed by de-epithelializing the pedicle, using bovie to create the superomedial pedicle, and removing breast tissue from the superior, lateral, and inferior portions of the breast.  Care was taken to not undermine the breast pedicle. Hemostasis was achieved.  The nipple was gently rotated into position and the soft tissue was closed with 4-0 Monocryl.   Cellerate was placed in the breast pocket.  The patient was sat upright and size and shape symmetry was confirmed.  The pocket was irrigated and hemostasis confirmed.  The deep tissues were approximated with 3-0 PDS and 3-0 Monocryl sutures and the skin was closed with deep dermal and subcuticular 4-0 Monocryl sutures.  Dermabond was applied.  A breast binder and ABDs were placed.  The nipple and skin flaps had good capillary refill at the end of the procedure.  The patient tolerated the procedure well. The patient was allowed to wake from anesthesia and taken to the recovery room in satisfactory condition.  The advanced practice practitioner (APP) assisted throughout the case.  The APP was essential in retraction and counter traction when needed to make the case progress smoothly.  This retraction and  assistance made it possible to see the tissue plans for the procedure.  The assistance was needed for blood control, tissue re-approximation and assisted with closure of the incision site.

## 2022-09-26 NOTE — Telephone Encounter (Signed)
Sister is calling saying that [facility] was trying to call her and she missed the call. I spoke with Joss and she stated that she would call [facility] to see if she could call sister back.

## 2022-09-26 NOTE — Transfer of Care (Signed)
Immediate Anesthesia Transfer of Care Note  Patient: Maureen Love [facility]  Procedure(s) Performed: BREAST REDUCTION WITH LIPOSUCTION (Bilateral: Breast)  Patient Location: PACU  Anesthesia Type:General  Level of Consciousness: awake, alert , and oriented  Airway & Oxygen Therapy: Patient Spontanous Breathing and Patient connected to nasal cannula oxygen  Post-op Assessment: Report given to RN and Post -op Vital signs reviewed and stable  Post vital signs: Reviewed and stable  Last Vitals:  Vitals Value Taken Time  BP 143/80 09/26/22 1315  Temp 36.8 C 09/26/22 1251  Pulse 93 09/26/22 1320  Resp 16 09/26/22 1320  SpO2 95 % 09/26/22 1320  Vitals shown include unvalidated device data.  Last Pain:  Vitals:   09/26/22 1300  TempSrc:   PainSc: 4       Patients Stated Pain Goal: 3 (30/16/01 0932)  Complications: No notable events documented.

## 2022-09-26 NOTE — Interval H&P Note (Signed)
History and Physical Interval Note:  09/26/2022 9:54 AM  Maureen Love  has presented today for surgery, with the diagnosis of Symptomatic mammary hypertrophy.  The various methods of treatment have been discussed with the patient and family. After consideration of risks, benefits and other options for treatment, the patient has consented to  Procedure(s): BREAST REDUCTION WITH LIPOSUCTION (Bilateral) as a surgical intervention.  The patient's history has been reviewed, patient examined, no change in status, stable for surgery.  I have reviewed the patient's chart and labs.  Questions were answered to the patient's satisfaction.     Loel Lofty Ciani Rutten

## 2022-09-26 NOTE — Telephone Encounter (Signed)
Message sent to Dr. Marla Roe

## 2022-09-26 NOTE — Anesthesia Postprocedure Evaluation (Signed)
Anesthesia Post Note  Patient: Maureen Love [facility]  Procedure(s) Performed: BREAST REDUCTION WITH LIPOSUCTION (Bilateral: Breast)     Patient location during evaluation: PACU Anesthesia Type: General Level of consciousness: sedated and patient cooperative Pain management: pain level controlled Vital Signs Assessment: post-procedure vital signs reviewed and stable Respiratory status: spontaneous breathing Cardiovascular status: stable Anesthetic complications: no   No notable events documented.  Last Vitals:  Vitals:   09/26/22 1315 09/26/22 1330  BP: (!) 143/80 (!) 156/85  Pulse: 97 93  Resp: 19 20  Temp:  36.8 C  SpO2: 96% 97%    Last Pain:  Vitals:   09/26/22 1400  TempSrc:   PainSc: 2                  Nolon Nations

## 2022-09-26 NOTE — Anesthesia Procedure Notes (Signed)
Procedure Name: LMA Insertion Date/Time: 09/26/2022 10:40 AM  Performed by: Willa Frater, CRNAPre-anesthesia Checklist: Patient identified, Emergency Drugs available, Suction available and Patient being monitored Patient Re-evaluated:Patient Re-evaluated prior to induction Oxygen Delivery Method: Circle system utilized Preoxygenation: Pre-oxygenation with 100% oxygen Induction Type: IV induction Ventilation: Mask ventilation without difficulty LMA: LMA inserted LMA Size: 4.0 Number of attempts: 1 Airway Equipment and Method: Bite block Placement Confirmation: positive ETCO2 Tube secured with: Tape Dental Injury: Teeth and Oropharynx as per pre-operative assessment

## 2022-09-26 NOTE — Discharge Instructions (Addendum)
INSTRUCTIONS FOR AFTER BREAST SURGERY   You will likely have some questions about what to expect following your operation.  The following information will help you and your family understand what to expect when you are discharged from the hospital.  It is important to follow these guidelines to help ensure a smooth recovery and reduce complication.  Postoperative instructions include information on: diet, wound care, medications and physical activity.  AFTER SURGERY Expect to go home after the procedure.  In some cases, you may need to spend one night in the hospital for observation.  DIET Breast surgery does not require a specific diet.  However, the healthier you eat the better your body will heal. It is important to increasing your protein intake.  This means limiting the foods with sugar and carbohydrates.  Focus on vegetables and some meat.  If you have liposuction during your procedure be sure to drink water.  If your urine is bright yellow, then it is concentrated, and you need to drink more water.  As a general rule after surgery, you should have 8 ounces of water every hour while awake.  If you find you are persistently nauseated or unable to take in liquids let us know.  NO TOBACCO USE or EXPOSURE.  This will slow your healing process and lead to a wound.  WOUND CARE Leave the binder on for 3 days . Use fragrance free soap like Dial, Dove or [place].   After 3 days you can remove the binder to shower. Once dry apply binder or sports bra. If you have liposuction you will have a soft and spongy dressing (Lipofoam) that helps prevent creases in your skin.  Remove before you shower and then replace it.  It is also available on [employer]. If you have steri-strips / tape directly attached to your skin leave them in place. It is OK to get these wet.   No baths, pools or hot tubs for four weeks. We close your incision to leave the smallest and best-looking scar. No ointment or creams on your incisions  for four weeks.  No Neosporin (Too many skin reactions).  A few weeks after surgery you can use Mederma and start massaging the scar. We ask you to wear your binder or sports bra for the first 6 weeks around the clock, including while sleeping. This provides added comfort and helps reduce the fluid accumulation at the surgery site. No Ice or heating pads to the operative site.  You have a very high risk of a burn before you feel the temperature change.  ACTIVITY No heavy lifting until cleared by the doctor.  This usually means no more than a half-gallon of milk.  It is OK to walk and climb stairs. Moving your legs is very important to decrease your risk of a blood clot.  It will also help keep you from getting deconditioned.  Every 1 to 2 hours get up and walk for 5 minutes. This will help with a quicker recovery back to normal.  Let pain be your guide so you don't do too much.  This time is for you to recover.  You will be more comfortable if you sleep and rest with your head elevated either with a few pillows under you or in a recliner.  No stomach sleeping for a three months.  WORK Everyone returns to work at different times. As a rough guide, most people take at least 1 - 2 weeks off prior to returning to work. If  you need documentation for your job, give the forms to the front staff at the clinic.  DRIVING Arrange for someone to bring you home from the hospital after your surgery.  You may be able to drive a few days after surgery but not while taking any narcotics or valium.  BOWEL MOVEMENTS Constipation can occur after anesthesia and while taking pain medication.  It is important to stay ahead for your comfort.  We recommend taking Milk of Magnesia (2 tablespoons; twice a day) while taking the pain pills.  MEDICATIONS You may be prescribed should start after surgery At your preoperative visit for you history and physical you may have been given the following medications: An antibiotic: Start  this medication when you get home and take according to the instructions on the bottle. Zofran 4 mg:  This is to treat nausea and vomiting.  You can take this every 6 hours as needed and only if needed. Valium 2 mg for breast cancer patients: This is for muscle tightness if you have an implant or expander. This will help relax your muscle which also helps with pain control.  This can be taken every 12 hours as needed. Don't drive after taking this medication. Norco (hydrocodone/acetaminophen) 5/325 mg:  This is only to be used after you have taken the Motrin or the Tylenol. Every 8 hours as needed.   Over the counter Medication to take: Ibuprofen (Motrin) 600 mg:  Take this every 6 hours.  If you have additional pain then take 500 mg of the Tylenol every 8 hours.  Only take the Norco after you have tried these two. MiraLAX or Milk of Magnesia: Take this according to the bottle if you take the [place] Call your surgeon's office if any of the following occur: Fever 101 degrees F or greater Excessive bleeding or fluid from the incision site. Pain that increases over time without aid from the medications Redness, warmth, or pus draining from incision sites Persistent nausea or inability to take in liquids Severe misshapen area that underwent the operation.   Post Anesthesia Home Care Instructions  Activity: Get plenty of rest for the remainder of the day. A responsible individual must stay with you for 24 hours following the procedure.  For the next 24 hours, DO NOT: -Drive a car -Paediatric nurse -Drink alcoholic beverages -Take any medication unless instructed by your physician -Make any legal decisions or sign important papers.  Meals: Start with liquid foods such as gelatin or soup. Progress to regular foods as tolerated. Avoid greasy, spicy, heavy foods. If nausea and/or vomiting occur, drink only clear liquids until the nausea and/or vomiting subsides. Call your  physician if vomiting continues.  Special Instructions/Symptoms: Your throat may feel dry or sore from the anesthesia or the breathing tube placed in your throat during surgery. If this causes discomfort, gargle with warm salt water. The discomfort should disappear within 24 hours.  If you had a scopolamine patch placed behind your ear for the management of post- operative nausea and/or vomiting:  1. The medication in the patch is effective for 72 hours, after which it should be removed.  Wrap patch in a tissue and discard in the trash. Wash hands thoroughly with soap and water. 2. You may remove the patch earlier than 72 hours if you experience unpleasant side effects which may include dry mouth, dizziness or visual disturbances. 3. Avoid touching the patch. Wash your hands with soap and water after contact with the  patch.  Regional Anesthesia Blocks  1. Numbness or the inability to move the "blocked" extremity may last from 3-48 hours after placement. The length of time depends on the medication injected and your individual response to the medication. If the numbness is not going away after 48 hours, call your surgeon.  2. The extremity that is blocked will need to be protected until the numbness is gone and the  Strength has returned. Because you cannot feel it, you will need to take extra care to avoid injury. Because it may be weak, you may have difficulty moving it or using it. You may not know what position it is in without looking at it while the block is in effect.  3. For blocks in the legs and feet, returning to weight bearing and walking needs to be done carefully. You will need to wait until the numbness is entirely gone and the strength has returned. You should be able to move your leg and foot normally before you try and bear weight or walk. You will need someone to be with you when you first try to ensure you do not fall and possibly risk injury.  4. Bruising and tenderness at the  needle site are common side effects and will resolve in a few days.  5. Persistent numbness or new problems with movement should be communicated to the surgeon or the [place] [phone] [place] [phone]).  No tylenol or ibuprofen until after 3pm if needed.

## 2022-09-26 NOTE — Anesthesia Preprocedure Evaluation (Addendum)
Anesthesia Evaluation  Patient identified by MRN, date of birth, ID band Patient awake    Reviewed: Allergy & Precautions, NPO status , Patient's Chart, lab work & pertinent test results  Airway Mallampati: I  TM Distance: >3 FB Neck ROM: Full    Dental no notable dental hx. (+) Dental Advisory Given   Pulmonary asthma    Pulmonary exam normal breath sounds clear to auscultation       Cardiovascular hypertension, Normal cardiovascular exam Rhythm:Regular Rate:Normal     Neuro/Psych  Headaches PSYCHIATRIC DISORDERS Anxiety Depression       GI/Hepatic Neg liver ROS,GERD  ,,  Endo/Other  negative endocrine ROS    Renal/GU negative Renal ROS     Musculoskeletal  (+) Arthritis ,    Abdominal  (+) + obese  Peds  Hematology negative hematology ROS (+)   Anesthesia Other Findings   Reproductive/Obstetrics                             Anesthesia Physical Anesthesia Plan  ASA: 2  Anesthesia Plan: General   Post-op Pain Management: Tylenol PO (pre-op)* and Celebrex PO (pre-op)*   Induction: Intravenous  PONV Risk Score and Plan: 4 or greater and Ondansetron, Dexamethasone, Treatment may vary due to age or medical condition and Propofol infusion  Airway Management Planned: LMA  Additional Equipment: None  Intra-op Plan:   Post-operative Plan: Extubation in OR  Informed Consent: I have reviewed the patients History and Physical, chart, labs and discussed the procedure including the risks, benefits and alternatives for the proposed anesthesia with the patient or authorized representative who has indicated his/her understanding and acceptance.     Dental advisory given  Plan Discussed with: CRNA  Anesthesia Plan Comments:         Anesthesia Quick Evaluation

## 2022-09-28 LAB — SURGICAL PATHOLOGY

## 2022-09-30 ENCOUNTER — Encounter (HOSPITAL_BASED_OUTPATIENT_CLINIC_OR_DEPARTMENT_OTHER): Payer: Self-pay | Admitting: Plastic Surgery

## 2022-10-02 ENCOUNTER — Ambulatory Visit (INDEPENDENT_AMBULATORY_CARE_PROVIDER_SITE_OTHER): Payer: PPO | Admitting: Physician Assistant

## 2022-10-02 VITALS — BP 152/88 | HR 82 | Resp 18

## 2022-10-02 DIAGNOSIS — N62 Hypertrophy of breast: Secondary | ICD-10-CM

## 2022-10-02 NOTE — Progress Notes (Signed)
This is a 71 year old female who is status post bilateral breast reduction on 09/26/2022 by Dr. Marla Roe.  Postoperatively the patient has done very well, she denies any issues or complaints.  She denies any fever or chills, pain well-controlled, no longer using her Percocet.  Chaperone present.  On exam bilateral breasts are symmetric, she has some bruising along the axilla, dressing in place, no overlying redness warmth or swelling, no palpable fluid collections, bilateral NAC's are viable.  Overall the patient is doing very well.  Dr. Marla Roe had an opportunity to evaluate the patient.  She will continue using compressive bra, she will follow-up in our office in 2 weeks for scheduled follow-up evaluation.  She may shower, patting dry the incisions and Steri-Strips.  She will follow-up immediately if she develops any new or worsening signs or symptoms.  She verbalized understanding and agreement to today's plan.  She will need postop photos in subsequent visits.

## 2022-10-16 ENCOUNTER — Ambulatory Visit (INDEPENDENT_AMBULATORY_CARE_PROVIDER_SITE_OTHER): Payer: PPO | Admitting: Surgical

## 2022-10-16 ENCOUNTER — Encounter: Payer: Self-pay | Admitting: Surgical

## 2022-10-16 VITALS — BP 141/79 | HR 88

## 2022-10-16 DIAGNOSIS — N62 Hypertrophy of breast: Secondary | ICD-10-CM

## 2022-10-16 NOTE — Progress Notes (Signed)
Patient is a 71 year old female here for follow-up after bilateral breast reduction on 09/26/2022 with Dr. Marla Roe.  She had 870 g removed from the right breast and 635 g removed from the left breast.  Patient reports she is doing really well, she is very happy with the size of her breast.  She reports that wearing the compression garment has been uncomfortable.  She has questions about ongoing restrictions and compression garments.  Chaperone present on exam On exam bilateral NAC's are viable, bilateral breast incisions are intact and healing well.  There is no erythema or cellulitic changes noted of either breast.  Bilateral breasts are soft, no subcutaneous fluid collection noted palpation.   A/P:  Continue with compressive garments 24/7 for total of 6 weeks after surgery.  Avoid strenuous activities or heavy lifting.  We discussed she can begin using scar cream in approximately 1 to 2 weeks. We will plan to see her back in 2 weeks for reevaluation.  Pictures were taken and placed in her chart with her permission.

## 2022-10-26 ENCOUNTER — Telehealth: Payer: Self-pay | Admitting: Surgical

## 2022-10-26 NOTE — Telephone Encounter (Signed)
LVM and my chart message of change appt date and time from 12/26 to 12/27 at 10:15 am.  Please contact office if this time does not work for her and we can reschedule to a time that works.Maureen Love

## 2022-10-30 ENCOUNTER — Encounter: Payer: PPO | Admitting: Surgical

## 2022-10-31 ENCOUNTER — Encounter: Payer: PPO | Admitting: Physician Assistant

## 2022-11-01 ENCOUNTER — Encounter: Payer: PPO | Admitting: Student

## 2022-11-01 ENCOUNTER — Ambulatory Visit (INDEPENDENT_AMBULATORY_CARE_PROVIDER_SITE_OTHER): Payer: PPO | Admitting: Student

## 2022-11-01 VITALS — BP 134/83 | HR 76

## 2022-11-01 DIAGNOSIS — N62 Hypertrophy of breast: Secondary | ICD-10-CM

## 2022-11-01 NOTE — Progress Notes (Deleted)
Patient is a 71 year old female who underwent bilateral breast reduction with Dr. Marla Roe on 09/26/2022.  She is 5 weeks stopped.  Intraoperatively, she had 170 g removed from the right breast and 635 g removed from the left breast.  Patient presents to the clinic today for postoperative follow-up.  Patient was last seen in the clinic on 10/16/2022.  At this visit, she reported she was doing well and was very happy with the size of her breast.  On exam, bilateral NAC's were viable, bilateral breast incisions were intact and healing well.  There is no erythema or cellulitic change in either breast.  Bilateral breasts are soft and there were no subcutaneous fluid collections noted to palpation.  Plan was for patient to continue to wear compressive garments for a total of 6 weeks after surgery and to avoid strenuous activities.  It was discussed with the patient that she could start scar creams in approximately 1 to 2 weeks.  Plan was for patient to come back for evaluation after 2 weeks.  Today,

## 2022-11-02 NOTE — Progress Notes (Signed)
Patient is a 71 year old female who underwent bilateral breast reduction with Dr. Marla Roe on 09/26/2022.  She is approximately 5 weeks postop.  Patient presents today for postoperative follow-up.  Patient was last seen in the clinic on 10/16/2022.  At this visit, patient reported she was doing well.  On exam, NAC's were viable bilaterally and breast incisions were intact and healing well.  Plan was for patient to continue compressive garments and return in 1 to 2 weeks for reevaluation.  Today, patient reports she is doing well.  She has no new complaints or concerns.  She denies any fevers or chills.  Patient reports that she is interested in discussing surgery for excess skin and tissue to her arms bilaterally.  Chaperone present on exam.  On exam, patient is sitting upright in no acute distress.  Breasts are soft and symmetric bilaterally.  NAC's are viable bilaterally incisions are intact and healing well bilaterally.  There is no overlying erythema or ecchymosis to either breast.  There was a little bit of firmness underneath the vertical limb incisions that appears to be consistent with scarring.  I discussed with the patient that she should continue compression and avoid strenuous activities for 1 more week, and after that she may begin to gradually increase her exercise.  I discussed that after a week she may transition into a regular bra without underwire.  I discussed with the patient that she can apply scar creams to her incisions such as Mederma, [place] or [place].  I discussed with the patient that she should gently massage the areas of firmness.  Patient expressed understanding.  Patient to follow-up in 1 month with Dr. Marla Roe to discuss the possibility of surgery such as brachioplasty.  I instructed the patient to call in the meantime if she has any questions or concerns.  Pictures were obtained of the patient and placed in the chart with the patient's or guardian's  permission.

## 2022-11-30 NOTE — Progress Notes (Unsigned)
Patient is a pleasant 72 year old female with PMH of macromastia s/p bilateral breast reduction performed 09/26/2022 Dr. Marla Roe who presents to clinic for postoperative follow-up.  She was last seen in our office on 11/01/2022.  At that time, exam was largely reassuring.

## 2022-12-03 ENCOUNTER — Ambulatory Visit (INDEPENDENT_AMBULATORY_CARE_PROVIDER_SITE_OTHER): Payer: PPO | Admitting: Physician Assistant

## 2022-12-03 DIAGNOSIS — Z9889 Other specified postprocedural states: Secondary | ICD-10-CM

## 2022-12-21 ENCOUNTER — Encounter: Payer: Self-pay | Admitting: Plastic Surgery

## 2022-12-21 ENCOUNTER — Ambulatory Visit (INDEPENDENT_AMBULATORY_CARE_PROVIDER_SITE_OTHER): Payer: Self-pay | Admitting: Plastic Surgery

## 2022-12-21 VITALS — BP 152/78 | HR 82 | Ht 60.0 in | Wt 183.0 lb

## 2022-12-21 DIAGNOSIS — Z719 Counseling, unspecified: Secondary | ICD-10-CM

## 2022-12-21 NOTE — Progress Notes (Signed)
   Subjective:    Patient ID: Maureen Love, female    DOB: July 06, 1951, 72 y.o.   MRN: [id number]  The patient is a 72 year old female here for evaluation of her arms.  She underwent bilateral breast reduction in November 2023.  She had over 600 g removed from both breasts.  She is 5 feet tall and weighs 176 pounds. Her past medical history is positive for gastric reflux, asthma, eczema, hypertension, hyper lipidemia, thyroid disease and osteoporosis.  She had an abdominoplasty, a bladder suspension breast reduction and nasal surgery.  She is now interested in bilateral brachioplasty.  We had a long discussion about expectations.      Review of Systems  Constitutional: Negative.   HENT: Negative.    Eyes: Negative.   Respiratory: Negative.  Negative for chest tightness and shortness of breath.   Cardiovascular: Negative.   Gastrointestinal: Negative.   Endocrine: Negative.   Genitourinary: Negative.   Musculoskeletal: Negative.        Objective:   Physical Exam Vitals and nursing note reviewed.  Constitutional:      Appearance: Normal appearance.  HENT:     Head: Normocephalic and atraumatic.  Cardiovascular:     Rate and Rhythm: Normal rate.     Pulses: Normal pulses.  Abdominal:     General: There is no distension.     Palpations: Abdomen is soft.     Tenderness: There is no abdominal tenderness.  Musculoskeletal:        General: No swelling or deformity.  Skin:    General: Skin is warm.     Capillary Refill: Capillary refill takes less than 2 seconds.     Coloration: Skin is not jaundiced.  Neurological:     Mental Status: She is alert and oriented to person, place, and time.  Psychiatric:        Mood and Affect: Mood normal.        Behavior: Behavior normal.        Thought Content: Thought content normal.        Judgment: Judgment normal.        Assessment & Plan:     ICD-10-CM   1. Encounter for counseling  Z71.9       Pictures were obtained of the  patient and placed in the chart with the patient's or guardian's permission. Will send a quote for brachioplasty and liposuction.  The patient will let us know if she is interested in proceeding.  The idea of the scar risks and complications were reviewed.

## 2022-12-31 ENCOUNTER — Encounter: Payer: Self-pay | Admitting: *Deleted

## 2023-02-05 ENCOUNTER — Institutional Professional Consult (permissible substitution): Payer: PPO | Admitting: Plastic Surgery

## 2023-03-14 ENCOUNTER — Telehealth: Payer: Self-pay | Admitting: *Deleted

## 2023-03-14 NOTE — Telephone Encounter (Signed)
Request for surgical clearance faxed to [facility], [place] [phone] with confirmation received

## 2023-03-15 ENCOUNTER — Encounter: Payer: Self-pay | Admitting: *Deleted

## 2023-03-15 NOTE — Telephone Encounter (Signed)
Surgical clearance received from pt's PCP- sent to batch scan and Caroline More, PA-C

## 2023-03-18 ENCOUNTER — Encounter: Payer: Self-pay | Admitting: Student

## 2023-03-18 ENCOUNTER — Ambulatory Visit (INDEPENDENT_AMBULATORY_CARE_PROVIDER_SITE_OTHER): Payer: Self-pay | Admitting: Student

## 2023-03-18 VITALS — BP 143/87 | HR 85 | Ht 61.0 in | Wt 181.0 lb

## 2023-03-18 DIAGNOSIS — Z719 Counseling, unspecified: Secondary | ICD-10-CM

## 2023-03-18 MED ORDER — OXYCODONE HCL 5 MG PO TABS: 5.0000 mg | ORAL_TABLET | Freq: Four times a day (QID) | ORAL | 0 refills | Status: AC | PRN

## 2023-03-18 MED ORDER — DOXYCYCLINE HYCLATE 100 MG PO TABS: 100.0000 mg | ORAL_TABLET | Freq: Two times a day (BID) | ORAL | 0 refills | Status: AC

## 2023-03-18 NOTE — Progress Notes (Signed)
Patient ID: Maureen Love, female    DOB: 1951/06/26, 72 y.o.   MRN: [id number]  Chief Complaint  Patient presents with   Pre-op Exam      ICD-10-CM   1. Encounter for counseling  Z71.9        History of Present Illness: Maureen Love is a 72 y.o.  female  with a history of excess skin and tissue of the arms.  She presents for preoperative evaluation for upcoming procedure, bilateral brachioplasty, scheduled for 04/15/2023 with Dr. Ulice Bold.  The patient has not had problems with anesthesia.  Patient denies any history of cardiac disease.  Clearance for surgery from patient's primary care provider, Carley Hammed, FNP has been received.  Per patient's PCP, patient may take bupropion and citalopram regularly in the morning.  Patient states she is not a smoker.  Patient denies taking any hormone replacement.  Patient states that she has had history of 1 miscarriage when she was 34.  Patient denies any personal or family history of blood clots or clotting diseases.  She denies any recent surgeries, traumas, infections.  She denies any history of stroke or heart attack.  Patient denies any history of Crohn's disease or ulcerative colitis.  She reports she does have asthma which she takes medication forward and is very controlled.  She denies any history of cancer.  She denies any varicosities to her lower extremities.  She denies any recent fevers, chills or changes in her health.  Summary of Previous Visit: Patient was seen in the clinic on 12/21/2022 by about Dr. Ulice Bold.  At this visit, patient reported she was interested in a bilateral brachioplasty.  Plan was to move forward with sending the patient a quote for brachioplasty and liposuction to see if the patient was interested in proceeding.  Expectations, scar risks and complications were discussed with the patient.  Job: Does not work at this time  PMH Significant for: Migraine, GERD, asthma, depression, insomnia   Past Medical  History: Allergies: Allergies  Allergen Reactions   Penicillins Itching and Swelling   Hydrocodone Rash   Morphine And Related Itching and Swelling    Current Medications:  Current Outpatient Medications:    albuterol (PROVENTIL HFA;VENTOLIN HFA) 108 (90 BASE) MCG/ACT inhaler, Inhale 2 puffs into the lungs every 6 (six) hours as needed for wheezing or shortness of breath., Disp: 1 Inhaler, Rfl: 1   BREO ELLIPTA 100-25 MCG/ACT AEPB, Inhale 1 puff into the lungs daily., Disp: , Rfl:    citalopram (CELEXA) 40 MG tablet, Take 40 mg by mouth every morning., Disp: , Rfl:    clonazePAM (KLONOPIN) 0.5 MG tablet, Take 0.5 mg by mouth 2 (two) times daily as needed for anxiety., Disp: , Rfl:    EPINEPHrine 0.3 mg/0.3 mL IJ SOAJ injection, AS DIRECTED AS NEEDED FOR SYSTEMIC REACTIONS INJECTION 30 DAYS, Disp: , Rfl:    Glucosamine-Chondroit-Vit C-Mn (GLUCOSAMINE 1500 COMPLEX) CAPS, Take 1 capsule by mouth daily. , Disp: , Rfl:    metFORMIN (GLUCOPHAGE-XR) 500 MG 24 hr tablet, SMARTSIG:1 Tablet(s) By Mouth Every Evening, Disp: , Rfl:    Omega-3 Fatty Acids (FISH OIL) 1200 MG CAPS, Take 1,000 mg by mouth daily. , Disp: , Rfl:    pantoprazole (PROTONIX) 40 MG tablet, Take 40 mg by mouth daily., Disp: , Rfl:    SUMAtriptan (IMITREX) 100 MG tablet, Take 100 mg by mouth every 2 (two) hours as needed for migraine. May repeat in 2 hours if headache persists  or recurs., Disp: , Rfl:    tretinoin (RETIN-A) 0.05 % cream, Apply topically., Disp: , Rfl:    Vitamin D, Ergocalciferol, (DRISDOL) 1.25 MG (50000 UNIT) CAPS capsule, Take 50,000 Units by mouth every 7 (seven) days., Disp: , Rfl:    zolpidem (AMBIEN) 5 MG tablet, Take 1 tablet (5 mg total) by mouth at bedtime as needed for sleep. (Patient taking differently: Take 2.5 mg by mouth at bedtime as needed for sleep.), Disp: 30 tablet, Rfl: 0  Current Facility-Administered Medications:    0.9 %  sodium chloride infusion, 500 mL, Intravenous, Once, Tressia Danas, MD  Past Medical Problems: Past Medical History:  Diagnosis Date   Abdomen firm on palpation    Acid reflux    Allergy    Anxiety    Arthritis    Asthma    Asthma    Back pain    Chronic headaches    Depression    Eczema    Headache    Hyperlipidemia    Hypertension    no meds   Mild asthma    uses dulera   OA (osteoarthritis)    OAB (overactive bladder)    Osteoporosis    SOB (shortness of breath) on exertion    Urge urinary incontinence     Past Surgical History: Past Surgical History:  Procedure Laterality Date   ABDOMINOPLASTY     BLADDER SUSPENSION  12 years ago   BREAST REDUCTION SURGERY Bilateral 09/26/2022   Procedure: BREAST REDUCTION WITH LIPOSUCTION;  Surgeon: Peggye Form, DO;  Location: [facility];  Service: Plastics;  Laterality: Bilateral;   BUNIONECTOMY Bilateral    bynum  2011 & 2019   both feet   CARDIOVASCULAR STRESS TEST  12/23/2007   NORMAL NUCLEAR STUDY   CYSTOSCOPY WITH INJECTION N/A 06/25/2013   Procedure: CYSTOSCOPY WITH BOTOX INJECTION;  Surgeon: Martina Sinner, MD;  Location: [facility];  Service: Urology;  Laterality: N/A;   CYSTOSCOPY WITH INJECTION N/A 12/14/2014   Procedure: CYSTOSCOPY WITH BOTOX  INJECTION;  Surgeon: Martina Sinner, MD;  Location: [facility] [place];  Service: Urology;  Laterality: N/A;   EXCISION LIPOMAS OF BACK AND RIGHT THIGH  11/28/2011   GYNECOLOGIC CRYOSURGERY  Age [age]   NASAL SEPTUM SURGERY  2012   NASAL SEPTUM SURGERY  2016   NASAL SINUS SURGERY  1990'S   RHINOPLASTY  1987   TRANSVAGINAL TAPE (TVT) REMOVAL  2006   TUBAL LIGATION  age 44    Social History: Social History   Socioeconomic History   Marital status: Widowed    Spouse name: Maurine Minister   Number of children: 1   Years of education: 12   Highest education level: Not on file  Occupational History   Occupation: retired    Comment: etired  Tobacco Use   Smoking status: Never    Smokeless tobacco: Never  Vaping Use   Vaping Use: Never used  Substance and Sexual Activity   Alcohol use: Yes    Comment: OCCASIONAL   Drug use: No   Sexual activity: Not Currently    Birth control/protection: Post-menopausal  Other Topics Concern   Not on file  Social History Narrative   Lives with spouse   Caffeine- daily 1 Dr Reino Kent, coffee 1 c   Social Determinants of Health   Financial Resource Strain: Not on file  Food Insecurity: Not on file  Transportation Needs: Not on file  Physical Activity: Not on file  Stress: Not on file  Social Connections: Not on file  Intimate Partner Violence: Not on file    Family History: Family History  Problem Relation Age of Onset   Hypertension Mother    Stroke Mother    Migraines Mother    Stomach cancer Mother    Hyperlipidemia Mother    Heart disease Mother    Cancer Mother    Depression Mother    Anxiety disorder Mother    Hypertension Father    Heart attack Father    Stroke Father    Hyperlipidemia Father    Heart disease Father    Thyroid disease Sister    Migraines Sister    Heart attack Son    Migraines Brother    Stroke Maternal Aunt    Stroke Maternal Uncle    Stroke Paternal Aunt    Stroke Paternal Uncle    Breast cancer Neg Hx    Esophageal cancer Neg Hx    Liver disease Neg Hx    Colon cancer Neg Hx    Pancreatic cancer Neg Hx    Rectal cancer Neg Hx     Review of Systems: Denies any recent fevers, chills or changes in her health  Physical Exam: Vital Signs BP (!) 143/87 (BP Location: Left Arm, Patient Position: Sitting, Cuff Size: Large)   Pulse 85   Ht 5\' 1"  (1.549 m)   Wt 181 lb (82.1 kg)   SpO2 97%   BMI 34.20 kg/m   Physical Exam  Constitutional:      General: Not in acute distress.    Appearance: Normal appearance. Not ill-appearing.  HENT:     Head: Normocephalic and atraumatic.  Neck:     Musculoskeletal: Normal range of motion.  Cardiovascular:     Rate and Rhythm:  Normal rate Pulmonary:     Effort: Pulmonary effort is normal. No respiratory distress.  Musculoskeletal: Normal range of motion.  Skin:    General: Skin is warm and dry.     Findings: No erythema or rash.  Neurological:     Mental Status: Alert and oriented to person, place, and time. Mental status is at baseline.  Psychiatric:        Mood and Affect: Mood normal.        Behavior: Behavior normal.    Assessment/Plan: The patient is scheduled for bilateral brachioplasty with Dr. Ulice Bold.  Risks, benefits, and alternatives of procedure discussed, questions answered and consent obtained.    Smoking Status: Non-smoker; Counseling Given?  N/A  Caprini Score: 5; Risk Factors include: Age, BMI > 25, and length of planned surgery. Recommendation for mechanical prophylaxis. Encourage early ambulation.   Pictures obtained: @consult   Post-op Rx sent to pharmacy:  Oxycodone, doxycycline -patient states that she did well with the oxycodone for her previous surgery.  Patient reports she has an allergy to penicillin so we will send in doxycycline which she took previously and did well with.   I instructed her patient to hold her Klonopin in the morning of surgery.  I also instructed her to hold her sumatriptan and Ambien the morning of surgery as well.  I discussed with the patient she should hold her supplements and over-the-counter vitamins at least 1 week prior to surgery.  Patient expressed understanding.  Patient was provided with the General Surgical Risk consent document and Pain Medication Agreement prior to their appointment.  They had adequate time to read through the risk consent documents and Pain Medication Agreement. We also discussed  them in person together during this preop appointment. All of their questions were answered to their satisfaction.  Recommended calling if they have any further questions.  Risk consent form and Pain Medication Agreement to be scanned into patient's  chart.  The consent was obtained with risks and complications reviewed which included bleeding, pain, scar, infection and the risk of anesthesia.  The patients questions were answered to the patients expressed satisfaction.    Electronically signed by: Laurena Spies, PA-C 03/18/2023 3:58 PM

## 2023-04-15 ENCOUNTER — Encounter: Payer: Self-pay | Admitting: Student

## 2023-04-15 DIAGNOSIS — Z411 Encounter for cosmetic surgery: Secondary | ICD-10-CM

## 2023-04-15 NOTE — Progress Notes (Signed)
Patient's friend who picked patient up from surgery today called the on-call service to inform our practice patient experienced nausea and headache after surgery today. Denied any specific issues with the surgical site. Patient's friend reports they called EMS and patient's BP was elevated in 220s. Patient was taken to the ER and per patient's friend patient is currently there receiving care. I offered patient to be seen in clinic tomorrow or later this week if patient would like for [place] to evaluate the surgical sites. Patient's friend will call us if patient coming to clinic tomorrow. I instructed her to call us if she has any further questions or concerns.

## 2023-04-16 ENCOUNTER — Encounter: Payer: Self-pay | Admitting: Plastic Surgery

## 2023-04-16 ENCOUNTER — Telehealth: Payer: Self-pay | Admitting: Physician Assistant

## 2023-04-16 NOTE — Telephone Encounter (Signed)
I spoke with Maureen Love today on the phone.  She had to go to the emergency room yesterday as she had episode of nausea and vomiting postoperatively when she got home.  She notes at that time her blood pressure was significantly elevated.  Her nausea and vomiting were controlled in the ER her blood pressure returned to normal and she was discharged home.  She notes today she is feeling very well with no significant complaints.  She feels that the antibiotic she was given during surgery caused her nausea.  In any event she is feeling well today, she will hold her doxycycline today to give her day to recover and will start taking it tomorrow.  If she has any issues moving forward to reach out to [place].  Otherwise we will see her in our office for routine follow-up.  She is happy with today's plan.

## 2023-04-18 ENCOUNTER — Encounter: Payer: PPO | Admitting: Surgical

## 2023-04-19 ENCOUNTER — Ambulatory Visit (INDEPENDENT_AMBULATORY_CARE_PROVIDER_SITE_OTHER): Payer: Self-pay | Admitting: Surgical

## 2023-04-19 DIAGNOSIS — Z719 Counseling, unspecified: Secondary | ICD-10-CM

## 2023-04-19 MED ORDER — OXYCODONE HCL 5 MG PO TABS: 5.0000 mg | ORAL_TABLET | Freq: Three times a day (TID) | ORAL | 0 refills | Status: AC | PRN

## 2023-04-19 NOTE — Progress Notes (Signed)
Patient is a 72 year old female here for follow-up after bilateral brachioplasty with Dr. Ulice Bold on 04/15/2023.  Patient reports overall she is doing well, she reports that she initially had some swelling in her right arm/hand, but this has improved as of today.  She reports she is drinking and eating normally, reports urination has been normal, but has had some constipation but this is improving.  She is not having any infectious symptoms.  She reports she has been using the oxycodone for pain control and it has been very helpful, but she has not been taking any Tylenol or ibuprofen.  She is requesting a refill of oxycodone.  She reports that she has some itching and burning under the dressings of her bilateral arms, however right worse than left.  She reports that JP drain output has been approximately 15 cc per 24 hours.  Chaperone present on exam On exam bilateral JP drains are in place and bilateral brachioplasty incisions.  Incisions appear intact and are healing well.  Steri-Strips are in place.  There is ecchymosis and swelling as expected, however bilateral upper extremity compartments are soft there is minimal tenderness noted.  Palpable pulses are noted.  No pitting edema is noted.  Sensation is intact.  She has normal range of motion of all 5 digits bilaterally.  There is no erythema or cellulitic changes noted.  A/P:  Bilateral JP drains were removed, patient tolerated this well.  Bilateral upper extremities were rewrapped with TopiFoam, Kerlix and Ace wrap's.  Recommend Vaseline and gauze to JP drain insertion site wounds daily.  Continue with avoiding strenuous activities or heavy lifting.  We will plan to see her back next week for reevaluation, will send a small refill of oxycodone for pain control.  Discussed with patient that if she is taking oxycodone, she should not be driving while taking this medication

## 2023-04-25 ENCOUNTER — Ambulatory Visit (INDEPENDENT_AMBULATORY_CARE_PROVIDER_SITE_OTHER): Payer: Self-pay | Admitting: Student

## 2023-04-25 DIAGNOSIS — Z719 Counseling, unspecified: Secondary | ICD-10-CM

## 2023-04-25 NOTE — Progress Notes (Signed)
Patient is a 71 year old female who underwent bilateral brachioplasty with Dr. Ulice Bold on 04/15/2023.  She is approximately 1 and half weeks postop.  She presents to the clinic today for postoperative follow-up.  Patient was last seen in the clinic on 04/19/2023.  At this visit, patient reported she was doing well.  She reported she was taking oxycodone for pain control and it was very helpful, but had not been taking any Tylenol or ibuprofen.  Patient also reported some itching and burning under the dressings of her bilateral arms, right worse than left.  JP drain output was approximately 15 cc per 24 hours.  On exam, JP drains were not placed in bilateral brachioplasty incisions were intact.  Steri-Strips are in place.  There is ecchymosis and swelling as expected.  Palpable pulses were noted.  Bilateral JP drains were removed.  Upper extremities were wrapped with Telfa foam, Kerlix and Ace wrap.  Recommended Vaseline and gauze to JP drain insertion sites.  Today, patient reports she is doing well.  She states she still has a little bit of soreness to her arms.  She denies any other issues or concerns.  She denies any drainage from her surgical sites.  She denies any fevers or chills.  She reports her pain has been controlled with Tylenol and ibuprofen.  On exam, patient is sitting upright in no acute distress.  To the right arm, incision appears to be intact and healing well.  There is no surrounding erythema or drainage.  There is very minimal tenderness to palpation on exam.  There are no obvious fluid collections palpated on exam.  To the left arm, incision appears to be intact and healing well.  There is no surrounding erythema or drainage.  No obvious fluid collections on exam.  Grip strength is 5 out of 5 bilaterally, sensation intact to digits bilaterally.  Pulses palpated bilaterally.  I discussed with the patient that she should wear compression on her upper extremities at all times except when  showering.  I discussed with her she may purchase a compressive sleeve or wrap with Ace wrap's.  Patient expressed understanding.  I recommended the patient apply Vaseline to her incision daily and gently massage to the incision as well.  Patient expressed understanding.  Patient to follow-up in about 2 weeks.  I instructed the patient to call in the meantime if she has any questions or concerns about anything.

## 2023-05-07 ENCOUNTER — Ambulatory Visit (INDEPENDENT_AMBULATORY_CARE_PROVIDER_SITE_OTHER): Payer: Self-pay | Admitting: Plastic Surgery

## 2023-05-07 DIAGNOSIS — Z719 Counseling, unspecified: Secondary | ICD-10-CM

## 2023-05-07 NOTE — Progress Notes (Signed)
The patient is a 72 year old female here for follow-up after undergoing brachioplasty 3 weeks ago.  The drains are out.  She is using compression.  She is extremely happy and looks really good.  No irritation redness or seroma noted.  No sign of hematoma.  Continue with the compression at least at bedtime and can start with the Mederma or skinuva.  Pictures were obtained of the patient and placed in the chart with the patient's or guardian's permission.

## 2024-11-30 ENCOUNTER — Telehealth: Payer: Self-pay | Admitting: Nurse Practitioner

## 2024-11-30 NOTE — Telephone Encounter (Signed)
 11/30/24 1336:   Called and patient does have correct fax number to our office.  Copied from CRM #[id number]. Topic: General - Phone/Fax/Address >> Nov 30, 2024  1:22 PM Brittany M wrote: Reason for CRM: Patient calling to fax # to office

## 2024-12-24 ENCOUNTER — Ambulatory Visit: Admitting: Family Medicine
